# Patient Record
Sex: Female | Born: 1970 | Race: Black or African American | Hispanic: No | Marital: Single | Smoking: Never smoker
Health system: Southern US, Community
[De-identification: ages and names within clinical notes are randomized; demographics above are authoritative.]

## PROBLEM LIST (undated history)

## (undated) DIAGNOSIS — E785 Hyperlipidemia, unspecified: Secondary | ICD-10-CM

## (undated) DIAGNOSIS — I10 Essential (primary) hypertension: Secondary | ICD-10-CM

## (undated) DIAGNOSIS — Z8619 Personal history of other infectious and parasitic diseases: Secondary | ICD-10-CM

## (undated) HISTORY — DX: Hyperlipidemia, unspecified: E78.5

## (undated) HISTORY — DX: Essential (primary) hypertension: I10

## (undated) HISTORY — DX: Personal history of other infectious and parasitic diseases: Z86.19

---

## 1999-12-07 ENCOUNTER — Other Ambulatory Visit: Admission: RE | Admit: 1999-12-07 | Discharge: 1999-12-07 | Payer: Self-pay | Admitting: Family Medicine

## 2000-11-05 ENCOUNTER — Emergency Department (HOSPITAL_COMMUNITY): Admission: EM | Admit: 2000-11-05 | Discharge: 2000-11-05 | Payer: Self-pay | Admitting: Emergency Medicine

## 2000-11-05 ENCOUNTER — Encounter: Payer: Self-pay | Admitting: Emergency Medicine

## 2002-07-20 ENCOUNTER — Other Ambulatory Visit: Admission: RE | Admit: 2002-07-20 | Discharge: 2002-07-20 | Payer: Self-pay | Admitting: Obstetrics and Gynecology

## 2002-09-06 ENCOUNTER — Encounter: Payer: Self-pay | Admitting: Emergency Medicine

## 2002-09-06 ENCOUNTER — Emergency Department (HOSPITAL_COMMUNITY): Admission: EM | Admit: 2002-09-06 | Discharge: 2002-09-06 | Payer: Self-pay | Admitting: Emergency Medicine

## 2003-04-08 ENCOUNTER — Ambulatory Visit (HOSPITAL_COMMUNITY): Admission: RE | Admit: 2003-04-08 | Discharge: 2003-04-08 | Payer: Self-pay | Admitting: *Deleted

## 2003-04-24 ENCOUNTER — Inpatient Hospital Stay (HOSPITAL_COMMUNITY): Admission: AD | Admit: 2003-04-24 | Discharge: 2003-04-24 | Payer: Self-pay | Admitting: Obstetrics

## 2003-09-03 ENCOUNTER — Inpatient Hospital Stay (HOSPITAL_COMMUNITY): Admission: AD | Admit: 2003-09-03 | Discharge: 2003-09-03 | Payer: Self-pay | Admitting: Obstetrics & Gynecology

## 2003-09-04 ENCOUNTER — Inpatient Hospital Stay (HOSPITAL_COMMUNITY): Admission: AD | Admit: 2003-09-04 | Discharge: 2003-09-07 | Payer: Self-pay | Admitting: Obstetrics & Gynecology

## 2003-09-04 ENCOUNTER — Encounter (INDEPENDENT_AMBULATORY_CARE_PROVIDER_SITE_OTHER): Payer: Self-pay | Admitting: Specialist

## 2008-03-07 ENCOUNTER — Ambulatory Visit (HOSPITAL_COMMUNITY): Admission: RE | Admit: 2008-03-07 | Discharge: 2008-03-07 | Payer: Self-pay | Admitting: Obstetrics & Gynecology

## 2008-08-29 ENCOUNTER — Ambulatory Visit (HOSPITAL_COMMUNITY): Admission: RE | Admit: 2008-08-29 | Discharge: 2008-08-29 | Payer: Self-pay | Admitting: Obstetrics & Gynecology

## 2010-09-21 NOTE — Op Note (Signed)
NAME:  Victoria Mejia, Victoria Mejia                      ACCOUNT NO.:  [id number]   MEDICAL RECORD NO.:  [id number]                   PATIENT TYPE:  INP   LOCATION:  9105                                 FACILITY:  [facility]   PHYSICIAN:  Roseanna Rainbow, M.D.         DATE OF BIRTH:  May 16, 1970   DATE OF PROCEDURE:  09/05/2003  DATE OF DISCHARGE:                                 OPERATIVE REPORT   PREOPERATIVE DIAGNOSES:  1. Intrauterine pregnancy at term.  2. Arrest of dilatation, active phase.  3. Chorioamnionitis.   POSTOPERATIVE DIAGNOSES:  1. Intrauterine pregnancy at term.  2. Arrest of dilatation, active phase.  3. Chorioamnionitis.   PROCEDURE:  Primary lower uterine flap elliptical cesarean delivery via  Pfannenstiel skin incision.   SURGEON:  Roseanna Rainbow, M.D.   ANESTHESIA:  Epidural.   ESTIMATED BLOOD LOSS:  800 mL.   URINE OUTPUT AND INTRAVENOUS FLUIDS:  As per anesthesiology.   COMPLICATIONS:  None.   PROCEDURE:  The patient was taken to the operating room with an epidural  catheter in place.  She was placed in the dorsal supine position and prepped  and draped in the usual sterile fashion with a leftward tilt.  A  Pfannenstiel skin incision was then made with a scalpel and carried out to  the underlying fascia with the Bovie.  The fascia was nicked in the midline  and this incision was extended bilaterally with curved Mayo scissors.  The  superior aspect of the fascial incision was then grasped with Kocher clamps  and the underlying rectus muscles were dissected off.  The inferior aspect  of the fascial incision was manipulated in a similar fashion.  The rectus  muscles were separated in the midline.  The parietal peritoneum was tented  up and entered sharply with Metzenbaum scissors.  This incision was then  extended superiorly and inferiorly with good visualization of the bladder.  The bladder blade was then placed.  The vesicouterine peritoneum was  grasped  and entered sharply with Metzenbaum scissors.  This incision was then  extended bilaterally and the bladder flap created digitally.  The bladder  blade was then replaced.  The lower uterine segment was incised in a  transverse fashion with the scalpel.  The uterine incision was then extended  bluntly.  The infant's head was then delivered atraumatically.  The infant  was suctioned with the bulb suction.  The cord was clamped and cut and the  infant was handed off to the awaiting neonatologist.  The placenta was then  removed.  The uterus was evacuated of any remaining amniotic fluid, clots,  and debris with a moistened laparotomy sponge.  The uterine incision was  then reapproximated in a running fashion with 0 Monocryl in a running  interlocking fashion.  A second layer of the same suture was used to  imbricate this layer.  Adequate hemostasis was noted.  The paracolic gutters  were then copiously irrigated.  The parietal peritoneum was reapproximated  with 0 Vicryl in a running  fashion.  The fascia was reapproximated with 0 Vicryl in a running fashion.  The skin was reapproximated with staples.  At the close of the procedure the  instrument and pack counts were said to be correct x1.  The patient was  taken awake to the PACU in stable condition.                                               Roseanna Rainbow, M.D.    Judee Clara  D:  09/05/2003  T:  09/05/2003  Job:  [id number]

## 2010-09-21 NOTE — Discharge Summary (Signed)
NAME:  Victoria Mejia, Victoria Mejia                      ACCOUNT NO.:  [id number]   MEDICAL RECORD NO.:  [id number]                   PATIENT TYPE:  INP   LOCATION:  9105                                 FACILITY:  [facility]   PHYSICIAN:  Roseanna Rainbow, M.D.         DATE OF BIRTH:  [birth year]   DATE OF ADMISSION:  09/04/2003  DATE OF DISCHARGE:  09/07/2003                                 DISCHARGE SUMMARY   ADMISSION DATE:  Sep 04, 2003   ATTENDING PHYSICIAN ON DAY OF ADMISSION:  Dr. Antionette Char.   ADMITTING DIAGNOSIS:  Intrauterine pregnancy at term in latent labor.   DATE OF DISCHARGE:  Sep 07, 2003   ATTENDING PHYSICIAN ON DAY OF DISCHARGE:  Dr. Antionette Char.   DISCHARGE DIAGNOSIS:  Postpartum postoperative day #2.5 status post primary  cesarean section in stable condition with viable newborn.   PATIENT PRESENTATION:  The patient presents to the maternity admissions unit  with spontaneous rupture of membranes at approximately 0225 on Sep 04, 2003  in latent labor.  The patient's cervical exam reveals 2 cm reveals 2 cm,  70%, vertex, at a -1 station, with clear fluid.  Fetal heart rate tracing is  reassuring with contractions every 3-5 minutes.  The patient is admitted for  labor management.   PATIENT HISTORY:  The patient is a gravida 2 para 0-0-1-0 with an unknown  last menstrual period.  EDC is Sep 10, 2003 by early second trimester  ultrasound.  The patient's labs: The patient is O positive, antibody screen  is negative, RPR is nonreactive, rubella titer is immune, hepatitis surface  antigen is negative,  group beta strep culture is negative.  The patient has  a history of sickle cell trait and an abnormal Pap smear with treatment in  1999 with a LEEP procedure followed by normal Pap smears.  The patient has  no known drug allergies.  The patient's current medications include a  prenatal vitamin once daily.  The patient has no substance use.  The patient  also has a  history during pregnancy of an elevated 1-hour sugar test with  borderline glucose intolerance.   The patient is admitted in latent labor on Sep 04, 2003 at approximately  2200.  The patient has an elevated temperature over 101 with arrest of  dilatation and early chorioamnionitis.  The patient is then transferred to  the OR suite for delivery by primary C-section with Dr. Antionette Char;  see operative note.  The patient delivered a female on Sep 04, 2003 at 2349  with Apgar scores of 8 and 9 respectively.  Postoperative day #0  preoperative hemoglobin was 9.2 with postoperative hemoglobin at 7.7.  Platelet count preoperatively was 376 with a postoperative platelet count of  291.  RPR is nonreactive during hospitalization.  The patient had an  uneventful postoperative course, is discharged on day #2.5.  Incision is  clean, dry, and intact with staples.  Vital signs  are within normal limits.  The patient is up ad lib, tolerating diet, without any complaints.  Discharged home in stable condition with newborn.  The patient is  breastfeeding with lactation support.  The patient is to follow up at the  [facility] in approximately 1 week for incision check.   MEDICATIONS AT DISCHARGE:  1. Motrin 800 mg q.8h. as needed for discomfort.  2. Percocet one to two tablets by mouth as needed p.r.n. for pain q.4-6h.  3. The patient is to start Micronor for contraception in approximately 3     weeks.  4. Continue prenatal vitamins once daily.  5. Hemocyte twice a day for chronic anemia compounded with acute blood loss     anemia.   WOUND CARE:  The patient to call for any increased pain or drainage from  incision site.   ACTIVITY:  Ad lib with restrictions of no heavy lifting x6 weeks.   The patient is on a regular diet at time of discharge.     Marlinda Mike, C.N.M.                      Roseanna Rainbow, M.D.    TB/MEDQ  D:  10/26/2003  T:  10/29/2003  Job:  [id number]

## 2014-07-25 ENCOUNTER — Encounter: Payer: Self-pay | Admitting: Obstetrics

## 2014-07-25 ENCOUNTER — Ambulatory Visit (INDEPENDENT_AMBULATORY_CARE_PROVIDER_SITE_OTHER): Payer: Federal, State, Local not specified - PPO | Admitting: Obstetrics

## 2014-07-25 VITALS — BP 142/92 | HR 94 | Temp 97.7°F | Wt 174.0 lb

## 2014-07-25 DIAGNOSIS — O1002 Pre-existing essential hypertension complicating childbirth: Secondary | ICD-10-CM | POA: Diagnosis not present

## 2014-07-25 DIAGNOSIS — Z30433 Encounter for removal and reinsertion of intrauterine contraceptive device: Secondary | ICD-10-CM

## 2014-07-25 DIAGNOSIS — Z01812 Encounter for preprocedural laboratory examination: Secondary | ICD-10-CM

## 2014-07-25 LAB — POCT URINE PREGNANCY: Preg Test, Ur: NEGATIVE

## 2014-07-25 MED ORDER — TRIAMTERENE-HCTZ 37.5-25 MG PO CAPS: 1.0000 | ORAL_CAPSULE | Freq: Every day | ORAL | Status: DC

## 2014-07-25 NOTE — Patient Instructions (Signed)

## 2014-07-25 NOTE — Progress Notes (Signed)
IUD Removal and Reinsertion Procedure Note  Pre-operative Diagnosis: Desires IUD Removal and Reinsertion  Post-operative Diagnosis: same   Procedure:  IUD Removal and Reinsertion  Indications: contraception continuation after expiration of IUD  Procedure Details  Urine pregnancy test was done in office and result was negative.  The risks (including infection, bleeding, pain, and uterine perforation) and benefits of the procedure were explained to the patient and Written informed consent was obtained.    Sterile speculum inserted and the cervix isolated.  IUD string grasped with long dressing forcep and the IUD was removed, intact.  Cervix cleansed with Betadine. Uterus sounded to 7 cm. IUD inserted without difficulty. String visible and trimmed. Patient tolerated procedure well.  IUD Information: Mirena, Lot # [id number], Expiration date 06 / 18.  Condition: Stable  Complications: None  Plan:  The patient was advised to call for any fever or for prolonged or severe pain or bleeding. She was advised to use NSAID as needed for mild to moderate pain.   Attending Physician Documentation: I was present for or participated in the entire procedure, including opening and closing.

## 2014-07-27 LAB — SURESWAB, VAGINOSIS/VAGINITIS PLUS
ATOPOBIUM VAGINAE: NOT DETECTED Log (cells/mL)
C. PARAPSILOSIS, DNA: NOT DETECTED
C. TRACHOMATIS RNA, TMA: NOT DETECTED
C. TROPICALIS, DNA: NOT DETECTED
C. albicans, DNA: NOT DETECTED
C. glabrata, DNA: NOT DETECTED
Gardnerella vaginalis: <4.7 Log (cells/mL)
LACTOBACILLUS SPECIES: 7.6 Log (cells/mL)
MEGASPHAERA SPECIES: NOT DETECTED Log (cells/mL)
N. gonorrhoeae RNA, TMA: NOT DETECTED
T. VAGINALIS RNA, QL TMA: NOT DETECTED

## 2014-09-20 ENCOUNTER — Ambulatory Visit: Payer: Federal, State, Local not specified - PPO | Admitting: Obstetrics

## 2014-09-20 ENCOUNTER — Ambulatory Visit (INDEPENDENT_AMBULATORY_CARE_PROVIDER_SITE_OTHER): Payer: Federal, State, Local not specified - PPO | Admitting: Obstetrics

## 2014-09-20 ENCOUNTER — Encounter: Payer: Self-pay | Admitting: Obstetrics

## 2014-09-20 VITALS — BP 138/96 | HR 87 | Temp 98.3°F | Ht 64.0 in | Wt 173.0 lb

## 2014-09-20 DIAGNOSIS — Z30431 Encounter for routine checking of intrauterine contraceptive device: Secondary | ICD-10-CM | POA: Diagnosis not present

## 2014-09-20 DIAGNOSIS — Z1239 Encounter for other screening for malignant neoplasm of breast: Secondary | ICD-10-CM

## 2014-09-20 DIAGNOSIS — Z01419 Encounter for gynecological examination (general) (routine) without abnormal findings: Secondary | ICD-10-CM | POA: Diagnosis not present

## 2014-09-20 DIAGNOSIS — R102 Pelvic and perineal pain unspecified side: Secondary | ICD-10-CM

## 2014-09-20 DIAGNOSIS — Z124 Encounter for screening for malignant neoplasm of cervix: Secondary | ICD-10-CM | POA: Diagnosis not present

## 2014-09-21 ENCOUNTER — Encounter: Payer: Self-pay | Admitting: Obstetrics

## 2014-09-21 NOTE — Progress Notes (Signed)
Subjective:        Victoria Mejia is a 44 y.o. female here for a routine exam.  Current complaints: Pelvic pain with intercourse..    Personal health questionnaire:  Is patient Ashkenazi Jewish, have a family history of breast and/or ovarian cancer: no Is there a family history of uterine cancer diagnosed at age < 66, gastrointestinal cancer, urinary tract cancer, family member who is a Field seismologist syndrome-associated carrier: no Is the patient overweight and hypertensive, family history of diabetes, personal history of gestational diabetes, preeclampsia or PCOS: no Is patient over 67, have PCOS,  family history of premature CHD under age 43, diabetes, smoke, have hypertension or peripheral artery disease:  no At any time, has a partner hit, kicked or otherwise hurt or frightened you?: no Over the past 2 weeks, have you felt down, depressed or hopeless?: no Over the past 2 weeks, have you felt little interest or pleasure in doing things?:no   Gynecologic History No LMP recorded. Patient is not currently having periods (Reason: IUD). Contraception: IUD Last Pap: 2015. Results were: normal Last mammogram: none. Results were: n/a  Obstetric History OB History  No data available    History reviewed. No pertinent past medical history.  History reviewed. No pertinent past surgical history.   Current outpatient prescriptions:  .  triamterene-hydrochlorothiazide (DYAZIDE) 37.5-25 MG per capsule, Take 1 each (1 capsule total) by mouth daily., Disp: 30 capsule, Rfl: 11 Not on File  History  Substance Use Topics  . Smoking status: Never Smoker   . Smokeless tobacco: Not on file  . Alcohol Use: 0.0 oz/week    0 Standard drinks or equivalent per week     Comment: occasional    History reviewed. No pertinent family history.    Review of Systems  Constitutional: negative for fatigue and weight loss Respiratory: negative for cough and wheezing Cardiovascular: negative for chest  pain, fatigue and palpitations Gastrointestinal: negative for abdominal pain and change in bowel habits Musculoskeletal:negative for myalgias Neurological: negative for gait problems and tremors Behavioral/Psych: negative for abusive relationship, depression Endocrine: negative for temperature intolerance   Genitourinary:negative for abnormal menstrual periods, genital lesions, hot flashes, sexual problems and vaginal discharge Integument/breast: negative for breast lump, breast tenderness, nipple discharge and skin lesion(s)    Objective:       BP 138/96 mmHg  Pulse 87  Temp(Src) 98.3 F (36.8 C)  Ht 5\' 4"  (1.626 m)  Wt 173 lb (78.472 kg)  BMI 29.68 kg/m2 General:   alert  Skin:   no rash or abnormalities  Lungs:   clear to auscultation bilaterally  Heart:   regular rate and rhythm, S1, S2 normal, no murmur, click, rub or gallop  Breasts:   normal without suspicious masses, skin or nipple changes or axillary nodes  Abdomen:  normal findings: no organomegaly, soft, non-tender and no hernia  Pelvis:  External genitalia: normal general appearance Urinary system: urethral meatus normal and bladder without fullness, nontender Vaginal: normal without tenderness, induration or masses Cervix: normal appearance Adnexa: normal bimanual exam Uterus: anteverted and non-tender, normal size   Lab Review Urine pregnancy test Labs reviewed yes Radiologic studies reviewed no    Assessment:    Healthy female exam.    IUD Surveillance.  String not visible.  Dyspareunia   Plan:   Ultrasound ordered for IUD placement.   Education reviewed: calcium supplements, low fat, low cholesterol diet, safe sex/STD prevention, self breast exams and weight bearing exercise. Contraception: IUD. Mammogram ordered.  Follow up in: 1 year.   No orders of the defined types were placed in this encounter.   Orders Placed This Encounter  Procedures  . SureSwab, Vaginosis/Vaginitis Plus  . US  Transvaginal Non-OB    Standing Status: Future     Number of Occurrences:      Standing Expiration Date: 11/20/2015    Order Specific Question:  Reason for Exam (SYMPTOM  OR DIAGNOSIS REQUIRED)    Answer:  LLQ pain.  IUD string not visible.    Order Specific Question:  Preferred imaging location?    Answer:  [facility]  . MM Digital Screening    Standing Status: Future     Number of Occurrences:      Standing Expiration Date: 11/20/2015    Order Specific Question:  Reason for Exam (SYMPTOM  OR DIAGNOSIS REQUIRED)    Answer:  screening    Order Specific Question:  Is the patient pregnant?    Answer:  No    Order Specific Question:  Preferred imaging location?    Answer:  [facility]  . US Pelvis Complete    Standing Status: Future     Number of Occurrences:      Standing Expiration Date: 11/20/2015    Order Specific Question:  Reason for Exam (SYMPTOM  OR DIAGNOSIS REQUIRED)    Answer:  pelvic pain    Order Specific Question:  Preferred imaging location?    Answer:  [facility]

## 2014-09-22 LAB — PAP IG AND HPV HIGH-RISK: HPV DNA High Risk: NOT DETECTED

## 2014-09-23 LAB — SURESWAB, VAGINOSIS/VAGINITIS PLUS
ATOPOBIUM VAGINAE: NOT DETECTED Log (cells/mL)
C. TROPICALIS, DNA: NOT DETECTED
C. albicans, DNA: NOT DETECTED
C. glabrata, DNA: NOT DETECTED
C. parapsilosis, DNA: NOT DETECTED
C. trachomatis RNA, TMA: NOT DETECTED
LACTOBACILLUS SPECIES: 6.6 Log (cells/mL)
MEGASPHAERA SPECIES: NOT DETECTED Log (cells/mL)
N. gonorrhoeae RNA, TMA: NOT DETECTED
T. VAGINALIS RNA, QL TMA: NOT DETECTED

## 2014-09-26 ENCOUNTER — Telehealth: Payer: Self-pay | Admitting: *Deleted

## 2014-09-26 NOTE — Telephone Encounter (Signed)
Unable to reach patient at time of Pre-Visit Call.  Left message for patient to return call when available.    

## 2014-09-27 ENCOUNTER — Encounter: Payer: Self-pay | Admitting: Family

## 2014-09-27 ENCOUNTER — Encounter: Payer: Self-pay | Admitting: *Deleted

## 2014-09-27 ENCOUNTER — Ambulatory Visit (INDEPENDENT_AMBULATORY_CARE_PROVIDER_SITE_OTHER): Payer: Federal, State, Local not specified - PPO | Admitting: Family

## 2014-09-27 VITALS — BP 126/92 | HR 73 | Temp 97.9°F | Resp 16 | Ht 63.5 in | Wt 177.0 lb

## 2014-09-27 DIAGNOSIS — I10 Essential (primary) hypertension: Secondary | ICD-10-CM | POA: Insufficient documentation

## 2014-09-27 DIAGNOSIS — E785 Hyperlipidemia, unspecified: Secondary | ICD-10-CM

## 2014-09-27 DIAGNOSIS — R0789 Other chest pain: Secondary | ICD-10-CM | POA: Diagnosis not present

## 2014-09-27 LAB — BASIC METABOLIC PANEL
BUN: 9 mg/dL (ref 6–23)
CHLORIDE: 104 meq/L (ref 96–112)
CO2: 28 meq/L (ref 19–32)
Calcium: 9.2 mg/dL (ref 8.4–10.5)
Creatinine, Ser: 0.6 mg/dL (ref 0.40–1.20)
GFR: 140.9 mL/min (ref >60.00)
Glucose, Bld: 73 mg/dL (ref 70–99)
POTASSIUM: 3.5 meq/L (ref 3.5–5.1)
Sodium: 137 mEq/L (ref 135–145)

## 2014-09-27 LAB — LIPID PANEL
CHOL/HDL RATIO: 4
Cholesterol: 181 mg/dL (ref 0–200)
HDL: 51.1 mg/dL (ref >39.00)
LDL Cholesterol: 113 mg/dL — ABNORMAL HIGH (ref 0–99)
NonHDL: 129.9
TRIGLYCERIDES: 83 mg/dL (ref 0.0–149.0)
VLDL: 16.6 mg/dL (ref 0.0–40.0)

## 2014-09-27 MED ORDER — HYDROCHLOROTHIAZIDE 25 MG PO TABS: 25.0000 mg | ORAL_TABLET | Freq: Every day | ORAL | Status: DC

## 2014-09-27 NOTE — Progress Notes (Signed)
Pre visit review using our clinic review tool, if applicable. No additional management support is needed unless otherwise documented below in the visit note. 

## 2014-09-27 NOTE — Patient Instructions (Signed)
Please complete lab work prior to leaving. Schedule a nurse visit in 2 weeks for blood pressure recheck and follow up blood work (bmet). Schedule fasting physical at the front desk for a convenient time.  Call if you develop recurrent chest pain or shortness of breath. Go to ER if severe.  Welcome to [facility]!

## 2014-09-27 NOTE — Addendum Note (Signed)
Addended by: Leticia Penna A on: 09/27/2014 08:20 AM   Modules accepted: Medications

## 2014-09-27 NOTE — Assessment & Plan Note (Signed)
DBP consistently >90. Advised pt re: importance of good BP control. Will start hctz 25mg  once daily. Obtain baseline bmet today. Follow up in 2 weeks for RN visit bp check and follow up bmet (dx htn).

## 2014-09-27 NOTE — Assessment & Plan Note (Addendum)
Sounded like pleuritic chest pain which has resolved. Obtain baseline cxr today. Follow up as outlined in AVS.

## 2014-09-27 NOTE — Assessment & Plan Note (Signed)
Obtain flp. Discussed importance of low sodium, low cholesterol diet, exercise and weight loss.

## 2014-09-27 NOTE — Progress Notes (Signed)
Subjective:    Patient ID: Victoria Mejia, female    DOB: 1971/04/27, 44 y.o.   MRN: [id number]  HPI  Victoria Mejia is a 44 yr old female who presents today to establish care.   HTN- Patient is currently maintained on the following medications for blood pressure: none, previously on triamterene/hctz- ran out last week.   Patient reports good compliance with blood pressure medications. Patient denies chest pain, shortness of breath or swelling. Last 3 blood pressure readings in our office are as follows: BP Readings from Last 3 Encounters:  09/27/14 126/92  09/20/14 138/96  07/25/14 142/92   Hyperlipidemia- Pt reports + hx of hyperlipidemia- no lipids in her system.   Intermittent pain under the left breast x 2 weeks.  "like a gas bubble."  Took gasex with some improvement. Denies current pain. Denies SOB, calf pain or recent long travel. Denies tenderness to palpation.   Review of Systems  Constitutional: Negative for unexpected weight change.  HENT: Positive for rhinorrhea. Negative for hearing loss.   Eyes: Negative for visual disturbance.  Respiratory: Negative for cough.   Cardiovascular: Negative for leg swelling.  Gastrointestinal: Negative for nausea, diarrhea and constipation.  Genitourinary: Negative for dysuria and frequency.  Musculoskeletal: Negative for myalgias and arthralgias.  Skin: Negative for rash.  Neurological: Negative for headaches.  Hematological: Negative for adenopathy.  Psychiatric/Behavioral: Negative for dysphoric mood and agitation.       Past Medical History  Diagnosis Date  . Hypertension   . Hyperlipidemia   . History of chicken pox     History   Social History  . Marital Status: Single    Spouse Name: N/A  . Number of Children: N/A  . Years of Education: N/A   Occupational History  . Not on file.   Social History Main Topics  . Smoking status: Never Smoker   . Smokeless tobacco: Not on file  . Alcohol Use: 0.0 oz/week    0 Standard drinks or equivalent per week     Comment: occasional  . Drug Use: No  . Sexual Activity: Yes    Birth Control/ Protection: IUD   Other Topics Concern  . Not on file   Social History Narrative    No past surgical history on file.  Family History  Problem Relation Age of Onset  . Hypertension Father   . Alcohol abuse Father   . Stroke Maternal Grandmother   . Arthritis Mother   . Hyperlipidemia Mother   . Heart disease Maternal Uncle     No Known Allergies  Current Outpatient Prescriptions on File Prior to Visit  Medication Sig Dispense Refill  . triamterene-hydrochlorothiazide (DYAZIDE) 37.5-25 MG per capsule Take 1 each (1 capsule total) by mouth daily. (Patient not taking: Reported on 09/27/2014) 30 capsule 11   No current facility-administered medications on file prior to visit.    BP 126/92 mmHg  Pulse 73  Temp(Src) 97.9 F (36.6 C) (Oral)  Resp 16  Ht 5' 3.5" (1.613 m)  Wt 177 lb (80.287 kg)  BMI 30.86 kg/m2  SpO2 99%    Objective:   Physical Exam  Constitutional: She is oriented to person, place, and time. She appears well-developed and well-nourished.  HENT:  Head: Normocephalic and atraumatic.  Right Ear: Tympanic membrane and ear canal normal.  Left Ear: Tympanic membrane and ear canal normal.  Mouth/Throat: No posterior oropharyngeal edema or posterior oropharyngeal erythema.  Cardiovascular: Normal rate, regular rhythm and normal heart sounds.  No murmur heard. Pulmonary/Chest: Effort normal and breath sounds normal. No respiratory distress. She has no wheezes.  Neurological: She is alert and oriented to person, place, and time.  Psychiatric: She has a normal mood and affect. Her behavior is normal. Judgment and thought content normal.          Assessment & Plan:

## 2014-09-27 NOTE — Telephone Encounter (Signed)
Pre-Visit Call completed with patient and chart updated.   Pre-Visit Info documented in Specialty Comments under [employer].    

## 2014-09-28 ENCOUNTER — Encounter: Payer: Self-pay | Admitting: Family

## 2014-10-06 ENCOUNTER — Ambulatory Visit (HOSPITAL_COMMUNITY): Payer: Federal, State, Local not specified - PPO

## 2014-10-06 ENCOUNTER — Other Ambulatory Visit: Payer: Self-pay | Admitting: Obstetrics

## 2014-10-06 ENCOUNTER — Ambulatory Visit (HOSPITAL_COMMUNITY)
Admission: RE | Admit: 2014-10-06 | Discharge: 2014-10-06 | Disposition: A | Discharge status: Home / Self Care | Payer: Federal, State, Local not specified - PPO | Source: Ambulatory Visit | Attending: Obstetrics | Admitting: Obstetrics

## 2014-10-06 DIAGNOSIS — D252 Subserosal leiomyoma of uterus: Secondary | ICD-10-CM | POA: Insufficient documentation

## 2014-10-06 DIAGNOSIS — R102 Pelvic and perineal pain: Secondary | ICD-10-CM | POA: Insufficient documentation

## 2014-10-06 DIAGNOSIS — N941 Dyspareunia: Secondary | ICD-10-CM | POA: Insufficient documentation

## 2014-10-06 DIAGNOSIS — Z30431 Encounter for routine checking of intrauterine contraceptive device: Secondary | ICD-10-CM | POA: Insufficient documentation

## 2014-10-06 DIAGNOSIS — Z1239 Encounter for other screening for malignant neoplasm of breast: Secondary | ICD-10-CM

## 2014-10-06 DIAGNOSIS — Z1231 Encounter for screening mammogram for malignant neoplasm of breast: Secondary | ICD-10-CM | POA: Diagnosis not present

## 2014-10-06 DIAGNOSIS — D250 Submucous leiomyoma of uterus: Secondary | ICD-10-CM | POA: Diagnosis not present

## 2014-10-11 ENCOUNTER — Ambulatory Visit: Payer: Federal, State, Local not specified - PPO | Admitting: Family

## 2014-10-11 VITALS — BP 138/91 | HR 87

## 2014-10-11 DIAGNOSIS — I10 Essential (primary) hypertension: Secondary | ICD-10-CM

## 2014-10-11 NOTE — Progress Notes (Signed)
Pre visit review using our clinic review tool, if applicable. No additional management support is needed unless otherwise documented below in the visit note.   Patient presents today for BP check per last office visit note:   HTN (hypertension) - [name], NP at 09/27/2014 10:50 AM     Status: Written Related Problem: HTN (hypertension)   Expand All Collapse All   DBP consistently >90. Advised pt re: importance of good BP control. Will start hctz 25mg  once daily. Obtain baseline bmet today. Follow up in 2 weeks for RN visit bp check and follow up bmet (dx htn).

## 2014-10-18 ENCOUNTER — Telehealth: Payer: Self-pay | Admitting: Family

## 2014-10-18 NOTE — Telephone Encounter (Signed)
Pre Visit letter sent  °

## 2014-10-27 ENCOUNTER — Encounter: Payer: Self-pay | Admitting: *Deleted

## 2014-11-08 ENCOUNTER — Encounter: Payer: Self-pay | Admitting: Obstetrics

## 2014-11-08 ENCOUNTER — Ambulatory Visit (INDEPENDENT_AMBULATORY_CARE_PROVIDER_SITE_OTHER): Payer: Federal, State, Local not specified - PPO | Admitting: Obstetrics

## 2014-11-08 VITALS — BP 167/108 | HR 98 | Temp 98.6°F | Ht 64.0 in | Wt 176.0 lb

## 2014-11-08 DIAGNOSIS — Z30433 Encounter for removal and reinsertion of intrauterine contraceptive device: Secondary | ICD-10-CM | POA: Diagnosis not present

## 2014-11-09 ENCOUNTER — Telehealth: Payer: Self-pay | Admitting: *Deleted

## 2014-11-09 ENCOUNTER — Encounter: Payer: Self-pay | Admitting: Obstetrics

## 2014-11-09 ENCOUNTER — Encounter: Payer: Self-pay | Admitting: Family

## 2014-11-09 ENCOUNTER — Ambulatory Visit (INDEPENDENT_AMBULATORY_CARE_PROVIDER_SITE_OTHER): Payer: Federal, State, Local not specified - PPO | Admitting: Family

## 2014-11-09 VITALS — BP 126/82 | HR 88 | Temp 98.2°F | Resp 16 | Ht 63.5 in | Wt 175.0 lb

## 2014-11-09 DIAGNOSIS — Z23 Encounter for immunization: Secondary | ICD-10-CM

## 2014-11-09 DIAGNOSIS — Z0000 Encounter for general adult medical examination without abnormal findings: Secondary | ICD-10-CM

## 2014-11-09 LAB — URINALYSIS, ROUTINE W REFLEX MICROSCOPIC
BILIRUBIN URINE: NEGATIVE
Ketones, ur: NEGATIVE
LEUKOCYTES UA: NEGATIVE
NITRITE: NEGATIVE
PH: 5.5 (ref 5.0–8.0)
SPECIFIC GRAVITY, URINE: 1.025 (ref 1.000–1.030)
TOTAL PROTEIN, URINE-UPE24: NEGATIVE
Urine Glucose: NEGATIVE
Urobilinogen, UA: 0.2 (ref 0.0–1.0)
WBC UA: NONE SEEN (ref 0–?)

## 2014-11-09 LAB — TSH: TSH: 1.64 u[IU]/mL (ref 0.35–4.50)

## 2014-11-09 LAB — HEPATIC FUNCTION PANEL
ALBUMIN: 4.2 g/dL (ref 3.5–5.2)
ALK PHOS: 73 U/L (ref 39–117)
ALT: 14 U/L (ref 0–35)
AST: 14 U/L (ref 0–37)
Bilirubin, Direct: 0.1 mg/dL (ref 0.0–0.3)
Total Bilirubin: 0.8 mg/dL (ref 0.2–1.2)
Total Protein: 7.4 g/dL (ref 6.0–8.3)

## 2014-11-09 MED ORDER — HYDROCHLOROTHIAZIDE 25 MG PO TABS: 25.0000 mg | ORAL_TABLET | Freq: Every day | ORAL | Status: DC

## 2014-11-09 NOTE — Patient Instructions (Signed)
Please complete lab work prior to leaving. Try to increase walking to 30 minutes 5 days a week and increase fresh fruits/veggies, reduce refined sugar.

## 2014-11-09 NOTE — Addendum Note (Signed)
Addended by: Kelle Darting A on: 11/09/2014 03:53 PM   Modules accepted: Orders

## 2014-11-09 NOTE — Progress Notes (Signed)
IUD Insertion Procedure Note  Pre-operative Diagnosis: Desires contraception  Post-operative Diagnosis: same  Indications: contraception  Procedure Details  Urine pregnancy test was done in office and result was negative.  The risks (including infection, bleeding, pain, and uterine perforation) and benefits of the procedure were explained to the patient and Written informed consent was obtained.    Cervix cleansed with Betadine. Uterus sounded to 7 cm. IUD inserted without difficulty. String visible and trimmed. Patient tolerated procedure well.  IUD Information: Mirena, Lot # [id number], Expiration date 27 / 64.  Condition: Stable  Complications: None  Plan:  The patient was advised to call for any fever or for prolonged or severe pain or bleeding. She was advised to use NSAID as needed for mild to moderate pain.   Attending Physician Documentation: I was present for or participated in the entire procedure, including opening and closing.

## 2014-11-09 NOTE — Assessment & Plan Note (Signed)
Tdap today. Discussed diet and exercise. Mammo/pap up to date.

## 2014-11-09 NOTE — Progress Notes (Signed)
Subjective:    Patient ID: Victoria Mejia, female    DOB: [birth year], 44 y.o.   MRN: [id number]  HPI  Victoria Mejia is a 44 yr old female who presents today for cpx.  Patient presents today for complete physical.  Immunizations: due for tetanus Diet: could be better.  Needs more fruits/veggies, less sugar Exercise: walks 2-3 times a week for 15-20 Pap Smear: 5/16- GYN normal per pt Mammogram:  Up to date Vision: up to date Dental:  Up to date    Review of Systems  Constitutional: Negative for unexpected weight change.  HENT: Negative for hearing loss and rhinorrhea.   Eyes: Negative for visual disturbance.  Respiratory: Negative for cough and shortness of breath.   Cardiovascular: Negative for leg swelling.  Gastrointestinal: Negative for nausea, diarrhea and constipation.  Genitourinary: Negative for dysuria, frequency and menstrual problem.  Musculoskeletal: Negative for myalgias and arthralgias.       Reports occasional right forearm discomfort  Skin: Negative for rash.  Neurological:       Reports mild HA's   Hematological: Negative for adenopathy.  Psychiatric/Behavioral: Negative for dysphoric mood and agitation.   Past Medical History  Diagnosis Date  . Hypertension   . Hyperlipidemia   . History of chicken pox     History   Social History  . Marital Status: Single    Spouse Name: N/A  . Number of Children: N/A  . Years of Education: N/A   Occupational History  . Not on file.   Social History Main Topics  . Smoking status: Never Smoker   . Smokeless tobacco: Not on file  . Alcohol Use: 0.0 oz/week    0 Standard drinks or equivalent per week     Comment: occasional  . Drug Use: No  . Sexual Activity: Yes    Birth Control/ Protection: IUD   Other Topics Concern  . Not on file   Social History Narrative   Single, has live in boyfriend   Daughter 2005   Works in [employer]   Completed college   No pets   Enjoys television       Past  Surgical History  Procedure Laterality Date  . Cesarean section  2005    Family History  Problem Relation Age of Onset  . Hypertension Father   . Alcohol abuse Father   . Stroke Maternal Grandmother   . Arthritis Mother   . Hyperlipidemia Mother   . Heart disease Maternal Uncle 50    No Known Allergies  No current outpatient prescriptions on file prior to visit.   No current facility-administered medications on file prior to visit.    BP 126/82 mmHg  Pulse 88  Temp(Src) 98.2 F (36.8 C) (Oral)  Resp 16  Ht 5' 3.5" (1.613 m)  Wt 175 lb (79.379 kg)  BMI 30.51 kg/m2  SpO2 99%       Objective:   Physical Exam  Physical Exam  Constitutional: She is oriented to person, place, and time. She appears well-developed and well-nourished. No distress.  HENT:  Head: Normocephalic and atraumatic.  Right Ear: Tympanic membrane and ear canal normal.  Left Ear: Tympanic membrane and ear canal normal.  Mouth/Throat: Oropharynx is clear and moist.  Eyes: Pupils are equal, round, and reactive to light. No scleral icterus.  Neck: Normal range of motion. No thyromegaly present.  Cardiovascular: Normal rate and regular rhythm.   No murmur heard. Pulmonary/Chest: Effort normal and breath sounds normal. No respiratory distress.  He has no wheezes. She has no rales. She exhibits no tenderness.  Abdominal: Soft. Bowel sounds are normal. He exhibits no distension and no mass. There is no tenderness. There is no rebound and no guarding.  Musculoskeletal: She exhibits no edema.  Lymphadenopathy:    She has no cervical adenopathy.  Neurological: She is alert and oriented to person, place, and time. She has normal patellar reflexes. She exhibits normal muscle tone. Coordination normal.  Skin: Skin is warm and dry.  Psychiatric: She has a normal mood and affect. Her behavior is normal. Judgment and thought content normal.  Breasts: Examined lying Right: Without masses, retractions, discharge  or axillary adenopathy.  Left: Without masses, retractions, discharge or axillary adenopathy.  Inguinal/mons: Normal without inguinal adenopathy       Assessment & Plan:       Assessment & Plan:

## 2014-11-09 NOTE — Progress Notes (Signed)
Subjective:    Victoria Mejia is a 44 y.o. female who presents for contraception counseling. The patient has c/o of heavy vaginal bleeding and cramping for past 3 weeks. The patient is sexually active. Pertinent past medical history: none.  The information documented in the HPI was reviewed and verified.  Menstrual History: OB History    No data available       No LMP recorded. Patient is not currently having periods (Reason: IUD).   Patient Active Problem List   Diagnosis Date Noted  . Preventative health care 11/09/2014  . HTN (hypertension) 09/27/2014  . Atypical chest pain 09/27/2014  . Hyperlipidemia 09/27/2014   Past Medical History  Diagnosis Date  . Hypertension   . Hyperlipidemia   . History of chicken pox     Past Surgical History  Procedure Laterality Date  . Cesarean section  2005     Current outpatient prescriptions:  .  hydrochlorothiazide (HYDRODIURIL) 25 MG tablet, Take 1 tablet (25 mg total) by mouth daily., Disp: 90 tablet, Rfl: 1 No Known Allergies  History  Substance Use Topics  . Smoking status: Never Smoker   . Smokeless tobacco: Not on file  . Alcohol Use: 0.0 oz/week    0 Standard drinks or equivalent per week     Comment: occasional    Family History  Problem Relation Age of Onset  . Hypertension Father   . Alcohol abuse Father   . Stroke Maternal Grandmother   . Arthritis Mother   . Hyperlipidemia Mother   . Heart disease Maternal Uncle 50       Review of Systems Constitutional: negative for weight loss Genitourinary:negative for abnormal menstrual periods and vaginal discharge   Objective:   BP 167/108 mmHg  Pulse 98  Temp(Src) 98.6 F (37 C)  Ht 5\' 4"  (1.626 m)  Wt 176 lb (79.833 kg)  BMI 30.20 kg/m2           General:  Alert.  No distress Abdomen:  normal findings: no organomegaly, soft, non-tender and no hernia  Pelvis:  External genitalia: normal general appearance Urinary system: urethral meatus normal and  bladder without fullness, nontender Vaginal: normal without tenderness, induration or masses Cervix: normal appearance.  Shaft of IUD observed protruding through cervical os.  IUD string grasped with dressing forceps and IUD easily removed, intact. Adnexa: normal bimanual exam Uterus: anteverted and non-tender, normal size   Lab Review Urine pregnancy test Labs reviewed yes Radiologic studies reviewed yes    Assessment:    44 y.o., continuing IUD, no contraindications.    Desires re insertion of new Mirena IUD  Plan:   IUD removal and re insertion ( see procedure note )   All questions answered. Discussed healthy lifestyle modifications. Follow up in 6 weeks.    No orders of the defined types were placed in this encounter.   Orders Placed This Encounter  Procedures  . US OB Transvaginal    Standing Status: Future     Number of Occurrences:      Standing Expiration Date: 01/09/2016    Order Specific Question:  Reason for Exam (SYMPTOM  OR DIAGNOSIS REQUIRED)    Answer:  IUD placement    Order Specific Question:  Preferred imaging location?    Answer:  Internal

## 2014-11-09 NOTE — Progress Notes (Signed)
Pre visit review using our clinic review tool, if applicable. No additional management support is needed unless otherwise documented below in the visit note. 

## 2014-11-09 NOTE — Telephone Encounter (Signed)
Attempted to document tdap injection into [employer] and found pt with same name / dob but different address of [address], [place]. Sent pt mychart message to verify if this was a previous address for pt. Awaiting response so I can update [facility].

## 2014-11-10 LAB — CBC WITH DIFFERENTIAL/PLATELET
BASOS ABS: 0 10*3/uL (ref 0.0–0.1)
Basophils Relative: 0.8 % (ref 0.0–3.0)
Eosinophils Absolute: 0.1 10*3/uL (ref 0.0–0.7)
Eosinophils Relative: 1.4 % (ref 0.0–5.0)
HEMATOCRIT: 38.2 % (ref 36.0–46.0)
Hemoglobin: 12.6 g/dL (ref 12.0–15.0)
LYMPHS ABS: 1.8 10*3/uL (ref 0.7–4.0)
LYMPHS PCT: 34.1 % (ref 12.0–46.0)
MCHC: 33.1 g/dL (ref 30.0–36.0)
MCV: 79.5 fl (ref 78.0–100.0)
MONO ABS: 0.2 10*3/uL (ref 0.1–1.0)
Monocytes Relative: 4.7 % (ref 3.0–12.0)
NEUTROS PCT: 59 % (ref 43.0–77.0)
Neutro Abs: 3.1 10*3/uL (ref 1.4–7.7)
PLATELETS: 369 10*3/uL (ref 150.0–400.0)
RBC: 4.8 Mil/uL (ref 3.87–5.11)
RDW: 15.9 % — ABNORMAL HIGH (ref 11.5–15.5)
WBC: 5.3 10*3/uL (ref 4.0–10.5)

## 2014-11-10 NOTE — Telephone Encounter (Signed)
Pt confirmed previous address via mychart message. [facility] updated.

## 2014-11-13 ENCOUNTER — Encounter: Payer: Self-pay | Admitting: Family

## 2014-12-01 ENCOUNTER — Other Ambulatory Visit: Payer: Federal, State, Local not specified - PPO

## 2014-12-20 ENCOUNTER — Ambulatory Visit: Payer: Federal, State, Local not specified - PPO | Admitting: Obstetrics

## 2014-12-22 ENCOUNTER — Ambulatory Visit (INDEPENDENT_AMBULATORY_CARE_PROVIDER_SITE_OTHER): Payer: Federal, State, Local not specified - PPO

## 2014-12-22 ENCOUNTER — Ambulatory Visit (INDEPENDENT_AMBULATORY_CARE_PROVIDER_SITE_OTHER): Payer: Federal, State, Local not specified - PPO | Admitting: Obstetrics

## 2014-12-22 ENCOUNTER — Other Ambulatory Visit: Payer: Self-pay | Admitting: Obstetrics

## 2014-12-22 ENCOUNTER — Encounter: Payer: Self-pay | Admitting: Obstetrics

## 2014-12-22 VITALS — BP 151/94 | HR 82 | Temp 98.2°F | Ht 64.0 in | Wt 180.1 lb

## 2014-12-22 DIAGNOSIS — Z30433 Encounter for removal and reinsertion of intrauterine contraceptive device: Secondary | ICD-10-CM | POA: Diagnosis not present

## 2014-12-22 DIAGNOSIS — Z975 Presence of (intrauterine) contraceptive device: Secondary | ICD-10-CM

## 2014-12-23 NOTE — Progress Notes (Signed)
Patient presented for ultrasound.  Appointment rescheduled for physician follow up.

## 2015-01-17 ENCOUNTER — Other Ambulatory Visit: Payer: Self-pay | Admitting: *Deleted

## 2015-01-17 ENCOUNTER — Encounter: Payer: Self-pay | Admitting: Obstetrics

## 2015-01-17 ENCOUNTER — Ambulatory Visit (INDEPENDENT_AMBULATORY_CARE_PROVIDER_SITE_OTHER): Payer: Federal, State, Local not specified - PPO | Admitting: Obstetrics

## 2015-01-17 VITALS — BP 134/96 | HR 93 | Temp 98.2°F | Ht 64.0 in | Wt 177.0 lb

## 2015-01-17 DIAGNOSIS — Z538 Procedure and treatment not carried out for other reasons: Secondary | ICD-10-CM

## 2015-01-17 DIAGNOSIS — Z975 Presence of (intrauterine) contraceptive device: Secondary | ICD-10-CM | POA: Diagnosis not present

## 2015-01-17 NOTE — H&P (Signed)
Patient ID: Victoria Mejia, female   DOB: [birth year], 44 y.o.   MRN: [id number]  Chief Complaint  Patient presents with  . Procedure    HPI Victoria Mejia is a 44 y.o. female.  H/O heavy vaginal bleeding with IUD.  Ultrasound revealed the IUD to be out of place in lower uterine segment.  Patient presents for IUD removal.  HPI  Past Medical History  Diagnosis Date  . Hypertension   . Hyperlipidemia   . History of chicken pox     Past Surgical History  Procedure Laterality Date  . Cesarean section  2005    Family History  Problem Relation Age of Onset  . Hypertension Father   . Alcohol abuse Father   . Stroke Maternal Grandmother   . Arthritis Mother   . Hyperlipidemia Mother   . Heart disease Maternal Uncle 42    Social History Social History  Substance Use Topics  . Smoking status: Never Smoker   . Smokeless tobacco: None  . Alcohol Use: 0.0 oz/week    0 Standard drinks or equivalent per week     Comment: occasional    No Known Allergies  Current Outpatient Prescriptions  Medication Sig Dispense Refill  . hydrochlorothiazide (HYDRODIURIL) 25 MG tablet Take 1 tablet (25 mg total) by mouth daily. 90 tablet 1   No current facility-administered medications for this visit.    Review of Systems Review of Systems Constitutional: negative for fatigue and weight loss Respiratory: negative for cough and wheezing Cardiovascular: negative for chest pain, fatigue and palpitations Gastrointestinal: negative for abdominal pain and change in bowel habits Genitourinary:negative Integument/breast: negative for nipple discharge Musculoskeletal:negative for myalgias Neurological: negative for gait problems and tremors Behavioral/Psych: negative for abusive relationship, depression Endocrine: negative for temperature intolerance     Blood pressure 134/96, pulse 93, temperature 98.2 F (36.8 C), height 5\' 4"  (1.626 m), weight 177 lb (80.287 kg), last menstrual period  12/14/2014.  Physical Exam Physical Exam            General:  Alert and no distress. Abdomen:  normal findings: no organomegaly, soft, non-tender and no hernia  Pelvis:  External genitalia: normal general appearance Urinary system: urethral meatus normal and bladder without fullness, nontender Vaginal: normal without tenderness, induration or masses Cervix: normal appearance.  IUD string visible and grasped with dressing forceps, but IUD would not flex and descend.       Data Reviewed Ultrasound  Assessment     Attempted IUD removal, unsuccessful.     Plan     Will schedule IUD removal in OR with Hysteroscopic guidance   No orders of the defined types were placed in this encounter.   No orders of the defined types were placed in this encounter.

## 2015-01-20 LAB — SURESWAB, VAGINOSIS/VAGINITIS PLUS
ATOPOBIUM VAGINAE: NOT DETECTED Log (cells/mL)
C. ALBICANS, DNA: NOT DETECTED
C. TRACHOMATIS RNA, TMA: NOT DETECTED
C. glabrata, DNA: NOT DETECTED
C. parapsilosis, DNA: NOT DETECTED
C. tropicalis, DNA: NOT DETECTED
Gardnerella vaginalis: NOT DETECTED Log (cells/mL)
LACTOBACILLUS SPECIES: >8 Log (cells/mL)
MEGASPHAERA SPECIES: NOT DETECTED Log (cells/mL)
N. gonorrhoeae RNA, TMA: NOT DETECTED
T. VAGINALIS RNA, QL TMA: NOT DETECTED

## 2015-01-23 ENCOUNTER — Telehealth: Payer: Self-pay | Admitting: *Deleted

## 2015-01-23 NOTE — Telephone Encounter (Signed)
Patient states she is scheduled for procedure at [facility] on Wednesday and she has not heard anything from them. She has heard from the pharmacy- but no one else. 10:43 Call to patient- LM on VM- Patient should arrive 1-2 hours prior to procedure and NPO at midnight. Patient given number to [facility] admitting so she can touch base with them.

## 2015-01-24 ENCOUNTER — Telehealth: Payer: Self-pay

## 2015-01-24 ENCOUNTER — Encounter (HOSPITAL_COMMUNITY): Payer: Self-pay | Admitting: Anesthesiology

## 2015-01-24 NOTE — Telephone Encounter (Signed)
Left message regarding when to arrive and NPO instructions.

## 2015-01-24 NOTE — Telephone Encounter (Signed)
patient has questions about her procedure tomorrow - [place] said she should talk to Dr, Jodi Mourning

## 2015-01-24 NOTE — Anesthesia Preprocedure Evaluation (Addendum)
Anesthesia Evaluation  Patient identified by MRN, date of birth, ID band Patient awake    Reviewed: Allergy & Precautions, NPO status , Patient's Chart, lab work & pertinent test results  Airway Mallampati: III  TM Distance: >3 FB Neck ROM: Full    Dental no notable dental hx. (+) Teeth Intact   Pulmonary neg pulmonary ROS,    Pulmonary exam normal breath sounds clear to auscultation       Cardiovascular hypertension, Pt. on medications Normal cardiovascular exam Rhythm:Regular Rate:Normal     Neuro/Psych negative neurological ROS  negative psych ROS   GI/Hepatic negative GI ROS, Neg liver ROS,   Endo/Other  Hyperlipidemia Obesity   Renal/GU negative Renal ROS  negative genitourinary   Musculoskeletal negative musculoskeletal ROS (+)   Abdominal   Peds  Hematology negative hematology ROS (+)   Anesthesia Other Findings   Reproductive/Obstetrics Embedded IUD                            Anesthesia Physical Anesthesia Plan  ASA: II  Anesthesia Plan: General   Post-op Pain Management:    Induction: Intravenous  Airway Management Planned: LMA  Additional Equipment:   Intra-op Plan:   Post-operative Plan: Extubation in OR  Informed Consent: I have reviewed the patients History and Physical, chart, labs and discussed the procedure including the risks, benefits and alternatives for the proposed anesthesia with the patient or authorized representative who has indicated his/her understanding and acceptance.   Dental advisory given  Plan Discussed with: Anesthesiologist, CRNA and Surgeon  Anesthesia Plan Comments:         Anesthesia Quick Evaluation

## 2015-01-25 ENCOUNTER — Ambulatory Visit (HOSPITAL_COMMUNITY)
Admission: RE | Admit: 2015-01-25 | Discharge: 2015-01-25 | Disposition: A | Discharge status: Home / Self Care | Payer: Federal, State, Local not specified - PPO | Source: Ambulatory Visit | Attending: Obstetrics | Admitting: Obstetrics

## 2015-01-25 ENCOUNTER — Encounter (HOSPITAL_COMMUNITY): Payer: Self-pay

## 2015-01-25 ENCOUNTER — Encounter (HOSPITAL_COMMUNITY)
Admission: RE | Disposition: A | Discharge status: Home / Self Care | Payer: Self-pay | Source: Ambulatory Visit | Attending: Obstetrics

## 2015-01-25 ENCOUNTER — Ambulatory Visit (HOSPITAL_COMMUNITY): Payer: Federal, State, Local not specified - PPO | Admitting: Anesthesiology

## 2015-01-25 ENCOUNTER — Other Ambulatory Visit: Payer: Self-pay | Admitting: Obstetrics

## 2015-01-25 DIAGNOSIS — T8332XA Displacement of intrauterine contraceptive device, initial encounter: Secondary | ICD-10-CM | POA: Insufficient documentation

## 2015-01-25 DIAGNOSIS — Z30432 Encounter for removal of intrauterine contraceptive device: Secondary | ICD-10-CM

## 2015-01-25 DIAGNOSIS — T8332XD Displacement of intrauterine contraceptive device, subsequent encounter: Secondary | ICD-10-CM | POA: Diagnosis not present

## 2015-01-25 DIAGNOSIS — N939 Abnormal uterine and vaginal bleeding, unspecified: Secondary | ICD-10-CM | POA: Diagnosis not present

## 2015-01-25 DIAGNOSIS — Z975 Presence of (intrauterine) contraceptive device: Secondary | ICD-10-CM | POA: Diagnosis not present

## 2015-01-25 HISTORY — PX: IUD REMOVAL: SHX5392

## 2015-01-25 HISTORY — PX: HYSTEROSCOPY: SHX211

## 2015-01-25 LAB — CBC
HEMATOCRIT: 30 % — AB (ref 36.0–46.0)
Hemoglobin: 10.1 g/dL — ABNORMAL LOW (ref 12.0–15.0)
MCH: 25.9 pg — AB (ref 26.0–34.0)
MCHC: 33.7 g/dL (ref 30.0–36.0)
MCV: 76.9 fL — AB (ref 78.0–100.0)
Platelets: 343 10*3/uL (ref 150–400)
RBC: 3.9 MIL/uL (ref 3.87–5.11)
RDW: 14.3 % (ref 11.5–15.5)
WBC: 5.1 10*3/uL (ref 4.0–10.5)

## 2015-01-25 SURGERY — REMOVAL, INTRAUTERINE DEVICE
Anesthesia: General

## 2015-01-25 MED ORDER — FENTANYL CITRATE (PF) 100 MCG/2ML IJ SOLN: 25.0000 ug | INTRAMUSCULAR | Status: DC | PRN

## 2015-01-25 MED ORDER — SCOPOLAMINE 1 MG/3DAYS TD PT72
MEDICATED_PATCH | TRANSDERMAL | Status: AC
  Filled 2015-01-25: qty 1

## 2015-01-25 MED ORDER — PROPOFOL 10 MG/ML IV BOLUS
INTRAVENOUS | Status: AC
Start: 2015-01-25 — End: 2015-01-25
  Filled 2015-01-25: qty 20

## 2015-01-25 MED ORDER — LIDOCAINE HCL (CARDIAC) 20 MG/ML IV SOLN
INTRAVENOUS | Status: DC | PRN
  Administered 2015-01-25: 100 mg via INTRAVENOUS

## 2015-01-25 MED ORDER — LIDOCAINE HCL 1 % IJ SOLN
INTRAMUSCULAR | Status: AC
  Filled 2015-01-25: qty 20

## 2015-01-25 MED ORDER — DEXAMETHASONE SODIUM PHOSPHATE 4 MG/ML IJ SOLN
INTRAMUSCULAR | Status: AC
  Filled 2015-01-25: qty 1

## 2015-01-25 MED ORDER — LIDOCAINE HCL (CARDIAC) 20 MG/ML IV SOLN
INTRAVENOUS | Status: AC
  Filled 2015-01-25: qty 5

## 2015-01-25 MED ORDER — FENTANYL CITRATE (PF) 250 MCG/5ML IJ SOLN
INTRAMUSCULAR | Status: AC
  Filled 2015-01-25: qty 25

## 2015-01-25 MED ORDER — PHENYLEPHRINE 40 MCG/ML (10ML) SYRINGE FOR IV PUSH (FOR BLOOD PRESSURE SUPPORT)
PREFILLED_SYRINGE | INTRAVENOUS | Status: AC
  Filled 2015-01-25: qty 10

## 2015-01-25 MED ORDER — HYDROCODONE-ACETAMINOPHEN 7.5-325 MG PO TABS: 1.0000 | ORAL_TABLET | Freq: Once | ORAL | Status: DC | PRN

## 2015-01-25 MED ORDER — LIDOCAINE HCL 1 % IJ SOLN
INTRAMUSCULAR | Status: DC | PRN
  Administered 2015-01-25: 20 mL

## 2015-01-25 MED ORDER — MIDAZOLAM HCL 2 MG/2ML IJ SOLN
INTRAMUSCULAR | Status: DC | PRN
  Administered 2015-01-25: 1 mg via INTRAVENOUS

## 2015-01-25 MED ORDER — KETOROLAC TROMETHAMINE 30 MG/ML IJ SOLN
INTRAMUSCULAR | Status: DC | PRN
  Administered 2015-01-25: 30 mg via INTRAVENOUS

## 2015-01-25 MED ORDER — ONDANSETRON HCL 4 MG/2ML IJ SOLN
INTRAMUSCULAR | Status: DC | PRN
  Administered 2015-01-25: 4 mg via INTRAVENOUS

## 2015-01-25 MED ORDER — MIDAZOLAM HCL 2 MG/2ML IJ SOLN
INTRAMUSCULAR | Status: AC
  Filled 2015-01-25: qty 4

## 2015-01-25 MED ORDER — PHENYLEPHRINE HCL 10 MG/ML IJ SOLN
INTRAMUSCULAR | Status: DC | PRN
  Administered 2015-01-25: 80 ug via INTRAVENOUS
  Administered 2015-01-25: 40 ug via INTRAVENOUS
  Administered 2015-01-25: 80 ug via INTRAVENOUS

## 2015-01-25 MED ORDER — LACTATED RINGERS IV SOLN
INTRAVENOUS | Status: DC
  Administered 2015-01-25 (×2): via INTRAVENOUS

## 2015-01-25 MED ORDER — MEPERIDINE HCL 25 MG/ML IJ SOLN: 6.2500 mg | INTRAMUSCULAR | Status: DC | PRN

## 2015-01-25 MED ORDER — SCOPOLAMINE 1 MG/3DAYS TD PT72
1.0000 | MEDICATED_PATCH | TRANSDERMAL | Status: DC
  Administered 2015-01-25: 1.5 mg via TRANSDERMAL

## 2015-01-25 MED ORDER — KETOROLAC TROMETHAMINE 30 MG/ML IJ SOLN
INTRAMUSCULAR | Status: AC
  Filled 2015-01-25: qty 1

## 2015-01-25 MED ORDER — PROPOFOL 10 MG/ML IV BOLUS
INTRAVENOUS | Status: DC | PRN
  Administered 2015-01-25: 150 mg via INTRAVENOUS

## 2015-01-25 MED ORDER — IBUPROFEN 800 MG PO TABS: 800.0000 mg | ORAL_TABLET | Freq: Three times a day (TID) | ORAL | Status: DC | PRN

## 2015-01-25 MED ORDER — ONDANSETRON HCL 4 MG/2ML IJ SOLN
INTRAMUSCULAR | Status: AC
  Filled 2015-01-25: qty 2

## 2015-01-25 MED ORDER — METOCLOPRAMIDE HCL 5 MG/ML IJ SOLN: 10.0000 mg | Freq: Once | INTRAMUSCULAR | Status: DC | PRN

## 2015-01-25 MED ORDER — FENTANYL CITRATE (PF) 100 MCG/2ML IJ SOLN
INTRAMUSCULAR | Status: DC | PRN
  Administered 2015-01-25: 50 ug via INTRAVENOUS
  Administered 2015-01-25: 100 ug via INTRAVENOUS

## 2015-01-25 MED ORDER — DEXAMETHASONE SODIUM PHOSPHATE 10 MG/ML IJ SOLN
INTRAMUSCULAR | Status: DC | PRN
  Administered 2015-01-25: 4 mg via INTRAVENOUS

## 2015-01-25 SURGICAL SUPPLY — 17 items
CANISTER SUCT 3000ML (MISCELLANEOUS) ×2 IMPLANT
CATH ROBINSON RED A/P 16FR (CATHETERS) ×2 IMPLANT
CLOTH BEACON ORANGE TIMEOUT ST (SAFETY) ×2 IMPLANT
CONTAINER PREFILL 10% NBF 60ML (FORM) ×4 IMPLANT
ELECT REM PT RETURN 9FT ADLT (ELECTROSURGICAL)
ELECTRODE REM PT RTRN 9FT ADLT (ELECTROSURGICAL) IMPLANT
GLOVE BIO SURGEON STRL SZ8 (GLOVE) ×4 IMPLANT
GOWN STRL REUS W/TWL LRG LVL3 (GOWN DISPOSABLE) ×4 IMPLANT
LOOP ANGLED CUTTING 22FR (CUTTING LOOP) IMPLANT
NEEDLE HYPO 25X5/8 SAFETYGLIDE (NEEDLE) IMPLANT
NEEDLE SPNL 20GX3.5 QUINCKE YW (NEEDLE) IMPLANT
PACK VAGINAL MINOR WOMEN LF (CUSTOM PROCEDURE TRAY) ×2 IMPLANT
PAD OB MATERNITY 4.3X12.25 (PERSONAL CARE ITEMS) ×2 IMPLANT
TOWEL OR 17X24 6PK STRL BLUE (TOWEL DISPOSABLE) ×4 IMPLANT
TUBING AQUILEX INFLOW (TUBING) ×2 IMPLANT
TUBING AQUILEX OUTFLOW (TUBING) ×2 IMPLANT
WATER STERILE IRR 1000ML POUR (IV SOLUTION) ×2 IMPLANT

## 2015-01-25 NOTE — Op Note (Signed)
Preoperative diagnosis: Attempted IUD Removal, Unsuccessful  Postoperative diagnosis: Same  Procedure: Diagnostic hysteroscopy, Removal of IUD  Surgeon: Baltazar Najjar A.  Anesthesia:general  Estimated blood loss: 40ml  Urine output: 44ml  IV Fluids: 224SL  Complications: None  Specimen: Mirena IUD  Operative Findings: Malpositioned IUD in lower uterine segment.  Description of procedure:   The patient was taken to the operating room and placed on the operating table in the semi-lithotomy position in [place].  Examination under anesthesia was performed.  The patient was prepped and draped in the usual manner.  After a time-out had been completed, a speculum was placed in the vagina.  The anterior lip of the cervix was grasped with a single-toothed tenaculum.    10 cc of 1% lidocaine were injected at 4 and 7 o'clock to produce a paracervical block.  The uterine cavity sounded to 7 cm.  The endocervical canal was dilated with Kennon Rounds dilators.  A 5 mm diagnostic hysteroscope with Glycine as the distending medium was used to perform a diagnostic hysteroscopy.  The findings are noted above.    The hysteroscope was removed.  A polyp forceps was then advanced into the uterine cavity and the IUD was grasped and removed intact. .  All the instruments were removed from the vagina.  Final instrument counts were correct.  The patient was taken to the PACU in stable condition.

## 2015-01-25 NOTE — Transfer of Care (Signed)
Immediate Anesthesia Transfer of Care Note  Patient: Victoria Mejia  Procedure(s) Performed: Procedure(s): INTRAUTERINE DEVICE (IUD) REMOVAL (N/A) HYSTEROSCOPY (N/A)  Patient Location: PACU  Anesthesia Type:General  Level of Consciousness: awake  Airway & Oxygen Therapy: Patient Spontanous Breathing  Post-op Assessment: Report given to PACU RN  Post vital signs: stable  Filed Vitals:   01/25/15 0721  BP: 136/86  Pulse: 84  Temp: 36.9 C  Resp: 20    Complications: No apparent anesthesia complications

## 2015-01-25 NOTE — Anesthesia Procedure Notes (Signed)
Procedure Name: LMA Insertion Date/Time: 01/25/2015 8:29 AM Performed by: Casimer Lanius A Pre-anesthesia Checklist: Patient being monitored, Patient identified, Emergency Drugs available and Suction available Patient Re-evaluated:Patient Re-evaluated prior to inductionOxygen Delivery Method: Circle system utilized Preoxygenation: Pre-oxygenation with 100% oxygen Intubation Type: IV induction and Inhalational induction Ventilation: Mask ventilation without difficulty LMA: LMA inserted LMA Size: 4.0 Number of attempts: 1 Dental Injury: Teeth and Oropharynx as per pre-operative assessment

## 2015-01-25 NOTE — H&P (Signed)
Victoria Mejia is an 44 y.o. female. Presents for hysteroscopy and removal of IUD.  Unable to remove IUD in office.  Pertinent Gynecological History: Menses: flow is heavy Bleeding: none Contraception: IUD DES exposure: denies Blood transfusions: none Sexually transmitted diseases: no past history Previous GYN Procedures: none  Last mammogram: normal Date: 2016 Last pap: normal Date: 2016   Menstrual History: Menarche age: 5  No LMP recorded. Patient is not currently having periods (Reason: IUD).    Past Medical History  Diagnosis Date  . Hypertension   . Hyperlipidemia   . History of chicken pox     Past Surgical History  Procedure Laterality Date  . Cesarean section  2005    Family History  Problem Relation Age of Onset  . Hypertension Father   . Alcohol abuse Father   . Stroke Maternal Grandmother   . Arthritis Mother   . Hyperlipidemia Mother   . Heart disease Maternal Uncle 68    Social History:  reports that she has never smoked. She does not have any smokeless tobacco history on file. She reports that she drinks alcohol. She reports that she does not use illicit drugs.  Allergies: No Known Allergies   (Not in a hospital admission)  Review of Systems  All other systems reviewed and are negative.   There were no vitals taken for this visit. Physical Exam  Nursing note and vitals reviewed. Constitutional: She is oriented to person, place, and time. She appears well-developed and well-nourished.  HENT:  Head: Normocephalic and atraumatic.  Eyes: Conjunctivae are normal. Pupils are equal, round, and reactive to light.  Neck: Normal range of motion. Neck supple.  Cardiovascular: Normal rate and regular rhythm.   Respiratory: Effort normal and breath sounds normal.  GI: Soft. Bowel sounds are normal.  Genitourinary: Vagina normal and uterus normal.  Musculoskeletal: Normal range of motion.  Neurological: She is alert and oriented to person, place,  and time.  Skin: Skin is warm and dry.  Psychiatric: She has a normal mood and affect. Her behavior is normal. Thought content normal.    No results found for this or any previous visit (from the past 24 hour(s)).  No results found.  Assessment/Plan: Unsuccessful IUD removal in office.  Plan hysteroscopy and removal of IUD.  HARPER,CHARLES A 01/25/2015, 6:58 AM

## 2015-01-25 NOTE — Anesthesia Postprocedure Evaluation (Signed)
  Anesthesia Post-op Note  Patient: Victoria Mejia  Procedure(s) Performed: Procedure(s): INTRAUTERINE DEVICE (IUD) REMOVAL (N/A) HYSTEROSCOPY (N/A)  Patient Location: PACU  Anesthesia Type:General  Level of Consciousness: awake, alert  and oriented  Airway and Oxygen Therapy: Patient Spontanous Breathing  Post-op Pain: none  Post-op Assessment: Post-op Vital signs reviewed, Patient's Cardiovascular Status Stable, Respiratory Function Stable, Patent Airway, No signs of Nausea or vomiting and Pain level controlled              Post-op Vital Signs: Reviewed and stable  Last Vitals:  Filed Vitals:   01/25/15 0945  BP: 134/90  Pulse: 78  Temp:   Resp: 20    Complications: No apparent anesthesia complications

## 2015-01-25 NOTE — Discharge Instructions (Signed)
DISCHARGE INSTRUCTIONS: HYSTEROSCOPY / ENDOMETRIAL ABLATION The following instructions have been prepared to help you care for yourself upon your return home.  May Remove Scop patch on or before 01/27/2015.  May take Ibuprofen after 2:45 pm as needed for cramps.   Personal hygiene:  Use sanitary pads for vaginal drainage, not tampons.  Shower the day after your procedure.  NO tub baths, pools or Jacuzzis for 2-3 weeks.  Wipe front to back after using the bathroom.  Activity and limitations:  Do NOT drive or operate any equipment for 24 hours. The effects of anesthesia are still present and drowsiness may result.  Do NOT rest in bed all day.  Walking is encouraged.  Walk up and down stairs slowly.  You may resume your normal activity in one to two days or as indicated by your physician.  Sexual activity: NO intercourse for at least 2 weeks after the procedure, or as indicated by your Doctor.  Diet: Eat a light meal as desired this evening. You may resume your usual diet tomorrow.  Return to Work: You may resume your work activities in one to two days or as indicated by Marine scientist.  What to expect after your surgery: Expect to have vaginal bleeding/discharge for 2-3 days and spotting for up to 10 days. It is not unusual to have soreness for up to 1-2 weeks. You may have a slight burning sensation when you urinate for the first day. Mild cramps may continue for a couple of days. You may have a regular period in 2-6 weeks.  Call your doctor for any of the following:  Excessive vaginal bleeding or clotting, saturating and changing one pad every hour.  Inability to urinate 6 hours after discharge from hospital.  Pain not relieved by pain medication.  Fever of 100.4 F or greater.  Unusual vaginal discharge or odor.

## 2015-01-26 ENCOUNTER — Encounter (HOSPITAL_COMMUNITY): Payer: Self-pay | Admitting: Obstetrics

## 2015-02-13 ENCOUNTER — Encounter: Payer: Self-pay | Admitting: Obstetrics

## 2015-02-13 ENCOUNTER — Ambulatory Visit (INDEPENDENT_AMBULATORY_CARE_PROVIDER_SITE_OTHER): Payer: Federal, State, Local not specified - PPO | Admitting: Obstetrics

## 2015-02-13 VITALS — BP 129/90 | HR 119 | Temp 98.2°F | Wt 171.0 lb

## 2015-02-13 DIAGNOSIS — Z30019 Encounter for initial prescription of contraceptives, unspecified: Secondary | ICD-10-CM

## 2015-02-13 DIAGNOSIS — Z3009 Encounter for other general counseling and advice on contraception: Secondary | ICD-10-CM | POA: Diagnosis not present

## 2015-02-13 MED ORDER — ETONOGESTREL-ETHINYL ESTRADIOL 0.12-0.015 MG/24HR VA RING
VAGINAL_RING | VAGINAL | Status: DC
Start: 2015-02-13 — End: 2016-03-08

## 2015-02-13 NOTE — Progress Notes (Signed)
Subjective:    Victoria Mejia is a 44 y.o. female who presents for contraception counseling. The patient has no complaints today. The patient is sexually active. Pertinent past medical history: none.  The information documented in the HPI was reviewed and verified.  Menstrual History: OB History    No data available       Patient's last menstrual period was 02/12/2015.   Patient Active Problem List   Diagnosis Date Noted  . Preventative health care 11/09/2014  . HTN (hypertension) 09/27/2014  . Atypical chest pain 09/27/2014  . Hyperlipidemia 09/27/2014   Past Medical History  Diagnosis Date  . Hypertension   . Hyperlipidemia   . History of chicken pox     Past Surgical History  Procedure Laterality Date  . Cesarean section  2005  . Iud removal N/A 01/25/2015    Procedure: INTRAUTERINE DEVICE (IUD) REMOVAL;  Surgeon: Shelly Bombard, MD;  Location: [facility];  Service: Gynecology;  Laterality: N/A;  . Hysteroscopy N/A 01/25/2015    Procedure: HYSTEROSCOPY;  Surgeon: Shelly Bombard, MD;  Location: [facility];  Service: Gynecology;  Laterality: N/A;     Current outpatient prescriptions:  .  hydrochlorothiazide (HYDRODIURIL) 25 MG tablet, Take 1 tablet (25 mg total) by mouth daily., Disp: 90 tablet, Rfl: 1 .  ibuprofen (ADVIL,MOTRIN) 800 MG tablet, Take 1 tablet (800 mg total) by mouth every 8 (eight) hours as needed., Disp: 30 tablet, Rfl: 5 .  etonogestrel-ethinyl estradiol (NUVARING) 0.12-0.015 MG/24HR vaginal ring, Insert vaginally and leave in place for 3 consecutive weeks, then remove for 1 week., Disp: 1 each, Rfl: 12 No Known Allergies  Social History  Substance Use Topics  . Smoking status: Never Smoker   . Smokeless tobacco: Not on file  . Alcohol Use: 0.0 oz/week    0 Standard drinks or equivalent per week     Comment: occasional    Family History  Problem Relation Age of Onset  . Hypertension Father   . Alcohol abuse Father   . Stroke Maternal Grandmother    . Arthritis Mother   . Hyperlipidemia Mother   . Heart disease Maternal Uncle 50       Review of Systems Constitutional: negative for weight loss Genitourinary:negative for abnormal menstrual periods and vaginal discharge   Objective:   BP 129/90 mmHg  Pulse 119  Temp(Src) 98.2 F (36.8 C)  Wt 171 lb (77.565 kg)  LMP 02/12/2015  PE:  Deferred   Lab Review Urine pregnancy test Labs reviewed yes Radiologic studies reviewed yes  100% of 10 min visit spent on counseling and coordination of care.   Assessment:    44 y.o., discontinuing IUD because of malposition x 2 after proper insertions. Patient wants to start NuvaRing -  no contraindications.   Plan:   NuvaRing Rx   All questions answered. Contraception: NuvaRing vaginal inserts. Neurosurgeon distributed. Follow up in 4 months.    Meds ordered this encounter  Medications  . etonogestrel-ethinyl estradiol (NUVARING) 0.12-0.015 MG/24HR vaginal ring    Sig: Insert vaginally and leave in place for 3 consecutive weeks, then remove for 1 week.    Dispense:  1 each    Refill:  12   No orders of the defined types were placed in this encounter.

## 2015-05-16 ENCOUNTER — Ambulatory Visit: Payer: Federal, State, Local not specified - PPO | Admitting: Family

## 2015-05-17 ENCOUNTER — Encounter: Payer: Self-pay | Admitting: Family

## 2015-05-17 ENCOUNTER — Telehealth: Payer: Self-pay | Admitting: Family

## 2015-05-17 ENCOUNTER — Ambulatory Visit (INDEPENDENT_AMBULATORY_CARE_PROVIDER_SITE_OTHER): Payer: Federal, State, Local not specified - PPO | Admitting: Family

## 2015-05-17 VITALS — BP 132/82 | HR 79 | Temp 98.7°F | Resp 18 | Ht 63.5 in | Wt 184.0 lb

## 2015-05-17 DIAGNOSIS — I10 Essential (primary) hypertension: Secondary | ICD-10-CM

## 2015-05-17 DIAGNOSIS — Z0000 Encounter for general adult medical examination without abnormal findings: Secondary | ICD-10-CM

## 2015-05-17 DIAGNOSIS — D649 Anemia, unspecified: Secondary | ICD-10-CM

## 2015-05-17 DIAGNOSIS — E785 Hyperlipidemia, unspecified: Secondary | ICD-10-CM

## 2015-05-17 LAB — BASIC METABOLIC PANEL
BUN: 10 mg/dL (ref 6–23)
CHLORIDE: 106 meq/L (ref 96–112)
CO2: 26 mEq/L (ref 19–32)
Calcium: 9.4 mg/dL (ref 8.4–10.5)
Creatinine, Ser: 0.67 mg/dL (ref 0.40–1.20)
GFR: 123.68 mL/min (ref >60.00)
Glucose, Bld: 109 mg/dL — ABNORMAL HIGH (ref 70–99)
POTASSIUM: 3.9 meq/L (ref 3.5–5.1)
SODIUM: 138 meq/L (ref 135–145)

## 2015-05-17 LAB — CBC WITH DIFFERENTIAL/PLATELET
BASOS PCT: 1 % (ref 0.0–3.0)
Basophils Absolute: 0.1 10*3/uL (ref 0.0–0.1)
EOS PCT: 0.6 % (ref 0.0–5.0)
Eosinophils Absolute: 0 10*3/uL (ref 0.0–0.7)
HEMATOCRIT: 31.7 % — AB (ref 36.0–46.0)
HEMOGLOBIN: 9.7 g/dL — AB (ref 12.0–15.0)
Lymphocytes Relative: 33.9 % (ref 12.0–46.0)
Lymphs Abs: 2 10*3/uL (ref 0.7–4.0)
MCHC: 30.7 g/dL (ref 30.0–36.0)
MONOS PCT: 6.5 % (ref 3.0–12.0)
Monocytes Absolute: 0.4 10*3/uL (ref 0.1–1.0)
Neutro Abs: 3.4 10*3/uL (ref 1.4–7.7)
Neutrophils Relative %: 58 % (ref 43.0–77.0)
Platelets: 500 10*3/uL — ABNORMAL HIGH (ref 150.0–400.0)
RBC: 4.94 Mil/uL (ref 3.87–5.11)
RDW: 20.4 % — AB (ref 11.5–15.5)
WBC: 5.8 10*3/uL (ref 4.0–10.5)

## 2015-05-17 LAB — IRON: Iron: 17 ug/dL — ABNORMAL LOW (ref 42–145)

## 2015-05-17 MED ORDER — IRON 325 (65 FE) MG PO TABS: 1.0000 | ORAL_TABLET | Freq: Two times a day (BID) | ORAL | Status: DC

## 2015-05-17 MED ORDER — HYDROCHLOROTHIAZIDE 25 MG PO TABS: 25.0000 mg | ORAL_TABLET | Freq: Every day | ORAL | Status: DC

## 2015-05-17 NOTE — Assessment & Plan Note (Addendum)
Lipids at goal. Cont low cholesterol diet.

## 2015-05-17 NOTE — Assessment & Plan Note (Signed)
Suspect iron deficiency- had iud removed in September.  Obtain cbc, iron level.

## 2015-05-17 NOTE — Assessment & Plan Note (Signed)
BP stable, continue hctz. Obtain follow up bmet.

## 2015-05-17 NOTE — Telephone Encounter (Signed)
Attempted to reach pt and left message to check mychart account.

## 2015-05-17 NOTE — Patient Instructions (Signed)
Please complete lab work prior to leaving. Continue hctz. You may use ibuprofen for a few days as needed for left foot pain.

## 2015-05-17 NOTE — Telephone Encounter (Signed)
Iron is low and she is anemic. Advise pt to start iron 325mg  bid repeat serum iron and cbc in 3 months.

## 2015-05-17 NOTE — Progress Notes (Signed)
Pre visit review using our clinic review tool, if applicable. No additional management support is needed unless otherwise documented below in the visit note. 

## 2015-05-17 NOTE — Progress Notes (Signed)
Subjective:    Patient ID: Victoria Mejia, female    DOB: August 04, 1970, 45 y.o.   MRN: [id number]  HPI  Victoria Mejia is a 45 yr old female who presents today for follow up.  1) HTN- maintained on hctz.  BP Readings from Last 3 Encounters:  05/17/15 132/82  02/13/15 129/90  01/25/15 138/95   2) Hyperlipidemia-not on statin. Lab Results  Component Value Date   CHOL 181 09/27/2014   HDL 51.10 09/27/2014   LDLCALC 113* 09/27/2014   TRIG 83.0 09/27/2014   CHOLHDL 4 09/27/2014   3) Left lateral foot pain- started several weeks ago.  Not bad enough to take any medication.  Notices the most first in the AM.    [facility]emic by [facility].  Had IUD until September.  + Pica- ice    Review of Systems  Respiratory: Negative for shortness of breath.   Cardiovascular: Negative for chest pain and leg swelling.   Past Medical History  Diagnosis Date  . Hypertension   . Hyperlipidemia   . History of chicken pox     Social History   Social History  . Marital Status: Single    Spouse Name: N/A  . Number of Children: N/A  . Years of Education: N/A   Occupational History  . Not on file.   Social History Main Topics  . Smoking status: Never Smoker   . Smokeless tobacco: Not on file  . Alcohol Use: 0.0 oz/week    0 Standard drinks or equivalent per week     Comment: occasional  . Drug Use: No  . Sexual Activity:    Partners: Male    Birth Control/ Protection:    Other Topics Concern  . Not on file   Social History Narrative   Single, has live in boyfriend   Daughter 2005   Works in [employer]   Completed college   No pets   Enjoys television       Past Surgical History  Procedure Laterality Date  . Cesarean section  2005  . Iud removal N/A 01/25/2015    Procedure: INTRAUTERINE DEVICE (IUD) REMOVAL;  Surgeon: Shelly Bombard, MD;  Location: [facility];  Service: Gynecology;  Laterality: N/A;  . Hysteroscopy N/A 01/25/2015    Procedure: HYSTEROSCOPY;   Surgeon: Shelly Bombard, MD;  Location: [facility];  Service: Gynecology;  Laterality: N/A;    Family History  Problem Relation Age of Onset  . Hypertension Father   . Alcohol abuse Father   . Stroke Maternal Grandmother   . Arthritis Mother   . Hyperlipidemia Mother   . Heart disease Maternal Uncle 50    No Known Allergies  Current Outpatient Prescriptions on File Prior to Visit  Medication Sig Dispense Refill  . etonogestrel-ethinyl estradiol (NUVARING) 0.12-0.015 MG/24HR vaginal ring Insert vaginally and leave in place for 3 consecutive weeks, then remove for 1 week. 1 each 12  . ibuprofen (ADVIL,MOTRIN) 800 MG tablet Take 1 tablet (800 mg total) by mouth every 8 (eight) hours as needed. 30 tablet 5   No current facility-administered medications on file prior to visit.    BP 132/82 mmHg  Pulse 79  Temp(Src) 98.7 F (37.1 C) (Oral)  Resp 18  Ht 5' 3.5" (1.613 m)  Wt 184 lb (83.462 kg)  BMI 32.08 kg/m2  SpO2 100%  LMP 05/10/2015       Objective:   Physical Exam  Constitutional: She is oriented  to person, place, and time. She appears well-developed and well-nourished.  HENT:  Head: Normocephalic and atraumatic.  Cardiovascular: Normal rate, regular rhythm and normal heart sounds.   No murmur heard. Pulmonary/Chest: Effort normal and breath sounds normal. No respiratory distress. She has no wheezes.  Musculoskeletal: She exhibits no edema.  Neurological: She is alert and oriented to person, place, and time.  Psychiatric: She has a normal mood and affect. Her behavior is normal. Judgment and thought content normal.          Assessment & Plan:

## 2015-06-09 ENCOUNTER — Encounter: Payer: Self-pay | Admitting: *Deleted

## 2015-06-09 NOTE — Telephone Encounter (Signed)
Pt has not read mychart message. Mailed letter to pt. 

## 2015-06-26 ENCOUNTER — Ambulatory Visit (INDEPENDENT_AMBULATORY_CARE_PROVIDER_SITE_OTHER): Payer: Federal, State, Local not specified - PPO | Admitting: Obstetrics

## 2015-06-26 ENCOUNTER — Encounter: Payer: Self-pay | Admitting: Obstetrics

## 2015-06-26 VITALS — BP 127/87 | HR 96 | Temp 98.1°F | Wt 179.0 lb

## 2015-06-26 DIAGNOSIS — Z3049 Encounter for surveillance of other contraceptives: Secondary | ICD-10-CM

## 2015-06-27 ENCOUNTER — Encounter: Payer: Self-pay | Admitting: Obstetrics

## 2015-06-27 NOTE — Progress Notes (Signed)
Subjective:    Victoria Mejia is a 45 y.o. female who presents for contraception counseling. The patient has complaints of vaginal burning with intercourse.  She does feel that she may scratch herself with NuvaRing insertion. The patient is sexually active. Pertinent past medical history: none.  The information documented in the HPI was reviewed and verified.  Menstrual History: OB History    No data available       Patient's last menstrual period was 06/08/2015.   Patient Active Problem List   Diagnosis Date Noted  . Anemia 05/17/2015  . Preventative health care 11/09/2014  . HTN (hypertension) 09/27/2014  . Atypical chest pain 09/27/2014  . Hyperlipidemia 09/27/2014   Past Medical History  Diagnosis Date  . Hypertension   . Hyperlipidemia   . History of chicken pox     Past Surgical History  Procedure Laterality Date  . Cesarean section  2005  . Iud removal N/A 01/25/2015    Procedure: INTRAUTERINE DEVICE (IUD) REMOVAL;  Surgeon: Shelly Bombard, MD;  Location: [facility];  Service: Gynecology;  Laterality: N/A;  . Hysteroscopy N/A 01/25/2015    Procedure: HYSTEROSCOPY;  Surgeon: Shelly Bombard, MD;  Location: [facility];  Service: Gynecology;  Laterality: N/A;     Current outpatient prescriptions:  .  etonogestrel-ethinyl estradiol (NUVARING) 0.12-0.015 MG/24HR vaginal ring, Insert vaginally and leave in place for 3 consecutive weeks, then remove for 1 week., Disp: 1 each, Rfl: 12 .  Ferrous Sulfate (IRON) 325 (65 Fe) MG TABS, Take 1 tablet by mouth 2 (two) times daily., Disp: 30 each, Rfl: 0 .  hydrochlorothiazide (HYDRODIURIL) 25 MG tablet, Take 1 tablet (25 mg total) by mouth daily., Disp: 90 tablet, Rfl: 1 .  ibuprofen (ADVIL,MOTRIN) 800 MG tablet, Take 1 tablet (800 mg total) by mouth every 8 (eight) hours as needed., Disp: 30 tablet, Rfl: 5 No Known Allergies  Social History  Substance Use Topics  . Smoking status: Never Smoker   . Smokeless tobacco: Not on file   . Alcohol Use: 0.0 oz/week    0 Standard drinks or equivalent per week     Comment: occasional    Family History  Problem Relation Age of Onset  . Hypertension Father   . Alcohol abuse Father   . Stroke Maternal Grandmother   . Arthritis Mother   . Hyperlipidemia Mother   . Heart disease Maternal Uncle 50       Review of Systems Constitutional: negative for weight loss Genitourinary:negative for abnormal menstrual periods and vaginal discharge   Objective:   BP 127/87 mmHg  Pulse 96  Temp(Src) 98.1 F (36.7 C)  Wt 179 lb (81.194 kg)  LMP 06/08/2015   General:   alert  Skin:   no rash or abnormalities  Lungs:   clear to auscultation bilaterally  Heart:   regular rate and rhythm, S1, S2 normal, no murmur, click, rub or gallop  Breasts:   normal without suspicious masses, skin or nipple changes or axillary nodes  Abdomen:  normal findings: no organomegaly, soft, non-tender and no hernia  Pelvis:  External genitalia: normal general appearance Urinary system: urethral meatus normal and bladder without fullness, nontender Vaginal: normal without tenderness, induration or masses Cervix: normal appearance Adnexa: normal bimanual exam Uterus: anteverted and non-tender, normal size   Lab Review Urine pregnancy test Labs reviewed yes Radiologic studies reviewed no    Assessment:    45 y.o., continuing NuvaRing vaginal inserts, no contraindications.   Plan:  Wear a glove with NuvaRing insertion to prevent scratching of vaginal mucosa.   All questions answered. Contraception: NuvaRing vaginal inserts. Follow up as needed.    No orders of the defined types were placed in this encounter.   No orders of the defined types were placed in this encounter.

## 2015-08-25 ENCOUNTER — Encounter: Payer: Self-pay | Admitting: Family

## 2015-08-25 ENCOUNTER — Ambulatory Visit (INDEPENDENT_AMBULATORY_CARE_PROVIDER_SITE_OTHER): Payer: Federal, State, Local not specified - PPO | Admitting: Family

## 2015-08-25 VITALS — BP 130/96 | HR 94 | Temp 98.8°F | Resp 16 | Ht 63.5 in | Wt 181.6 lb

## 2015-08-25 DIAGNOSIS — D508 Other iron deficiency anemias: Secondary | ICD-10-CM | POA: Diagnosis not present

## 2015-08-25 DIAGNOSIS — E785 Hyperlipidemia, unspecified: Secondary | ICD-10-CM | POA: Diagnosis not present

## 2015-08-25 DIAGNOSIS — R739 Hyperglycemia, unspecified: Secondary | ICD-10-CM

## 2015-08-25 DIAGNOSIS — I10 Essential (primary) hypertension: Secondary | ICD-10-CM

## 2015-08-25 LAB — BASIC METABOLIC PANEL
BUN: 12 mg/dL (ref 6–23)
CHLORIDE: 102 meq/L (ref 96–112)
CO2: 27 meq/L (ref 19–32)
CREATININE: 0.77 mg/dL (ref 0.40–1.20)
Calcium: 10 mg/dL (ref 8.4–10.5)
GFR: 105.2 mL/min (ref >60.00)
Glucose, Bld: 112 mg/dL — ABNORMAL HIGH (ref 70–99)
Potassium: 4.3 mEq/L (ref 3.5–5.1)
Sodium: 138 mEq/L (ref 135–145)

## 2015-08-25 LAB — IRON: IRON: 67 ug/dL (ref 42–145)

## 2015-08-25 LAB — CBC WITH DIFFERENTIAL/PLATELET
BASOS PCT: 0.8 % (ref 0.0–3.0)
Basophils Absolute: 0 10*3/uL (ref 0.0–0.1)
EOS PCT: 0.9 % (ref 0.0–5.0)
Eosinophils Absolute: 0.1 10*3/uL (ref 0.0–0.7)
HEMATOCRIT: 38.8 % (ref 36.0–46.0)
HEMOGLOBIN: 12.7 g/dL (ref 12.0–15.0)
Lymphocytes Relative: 30.9 % (ref 12.0–46.0)
Lymphs Abs: 1.9 10*3/uL (ref 0.7–4.0)
MCHC: 32.8 g/dL (ref 30.0–36.0)
MCV: 71.4 fl — ABNORMAL LOW (ref 78.0–100.0)
MONO ABS: 0.4 10*3/uL (ref 0.1–1.0)
Monocytes Relative: 5.9 % (ref 3.0–12.0)
NEUTROS ABS: 3.8 10*3/uL (ref 1.4–7.7)
Neutrophils Relative %: 61.5 % (ref 43.0–77.0)
PLATELETS: 452 10*3/uL — AB (ref 150.0–400.0)
RBC: 5.44 Mil/uL — ABNORMAL HIGH (ref 3.87–5.11)
RDW: 24.7 % — AB (ref 11.5–15.5)
WBC: 6.1 10*3/uL (ref 4.0–10.5)

## 2015-08-25 LAB — FERRITIN: Ferritin: 23.5 ng/mL (ref 10.0–291.0)

## 2015-08-25 LAB — HEMOGLOBIN A1C: HEMOGLOBIN A1C: 6 % (ref 4.6–6.5)

## 2015-08-25 NOTE — Assessment & Plan Note (Signed)
Discussed use of colace prn constipation on the iron. Continue iron, obtain follow up iron studies and cbc with diff.

## 2015-08-25 NOTE — Assessment & Plan Note (Signed)
DBP elevated today- did not take HCTZ yet this AM. Will plan to repeat in 3 months, she is advised to take HCTZ dose prior to next appointment.

## 2015-08-25 NOTE — Progress Notes (Signed)
Pre visit review using our clinic review tool, if applicable. No additional management support is needed unless otherwise documented below in the visit note. 

## 2015-08-25 NOTE — Patient Instructions (Signed)
Please complete lab work prior to leaving. Follow up in 3 months.  

## 2015-08-25 NOTE — Progress Notes (Signed)
Subjective:    Patient ID: Victoria Mejia, female    DOB: [birth year], 45 y.o.   MRN: [id number]  HPI  [name] is a 45 yr old female who presents today for follow up.  1) HTN- Maintained on hctz. Reports good compliance with medication.   BP Readings from Last 3 Encounters:  08/25/15 130/96  06/26/15 127/87  05/17/15 132/82   2) Hyperlipidemia- last lipid panel was at goal.  Lab Results  Component Value Date   CHOL 181 09/27/2014   HDL 51.10 09/27/2014   LDLCALC 113* 09/27/2014   TRIG 83.0 09/27/2014   CHOLHDL 4 09/27/2014    3) anemia- She is maintained on iron. No longer eating ice.  Mild constipation on iron.   Lab Results  Component Value Date   WBC 5.8 05/17/2015   HGB 9.7* 05/17/2015   HCT 31.7* 05/17/2015   MCV 64.2 Repeated and verified X2.* 05/17/2015   PLT 500.0* 05/17/2015     Review of Systems See HPI  Past Medical History  Diagnosis Date  . Hypertension   . Hyperlipidemia   . History of chicken pox      Social History   Social History  . Marital Status: Single    Spouse Name: N/A  . Number of Children: N/A  . Years of Education: N/A   Occupational History  . Not on file.   Social History Main Topics  . Smoking status: Never Smoker   . Smokeless tobacco: Not on file  . Alcohol Use: 0.0 oz/week    0 Standard drinks or equivalent per week     Comment: occasional  . Drug Use: No  . Sexual Activity:    Partners: Male    Birth Control/ Protection: Inserts   Other Topics Concern  . Not on file   Social History Narrative   Single, has live in boyfriend   Daughter 2005   Works in [employer]   Completed college   No pets   Enjoys television       Past Surgical History  Procedure Laterality Date  . Cesarean section  2005  . Iud removal N/A 01/25/2015    Procedure: INTRAUTERINE DEVICE (IUD) REMOVAL;  Surgeon: Shelly Bombard, MD;  Location: [facility];  Service: Gynecology;  Laterality: N/A;  . Hysteroscopy N/A 01/25/2015   Procedure: HYSTEROSCOPY;  Surgeon: Shelly Bombard, MD;  Location: [facility];  Service: Gynecology;  Laterality: N/A;    Family History  Problem Relation Age of Onset  . Hypertension Father   . Alcohol abuse Father   . Stroke Maternal Grandmother   . Arthritis Mother   . Hyperlipidemia Mother   . Heart disease Maternal Uncle 50    No Known Allergies  Current Outpatient Prescriptions on File Prior to Visit  Medication Sig Dispense Refill  . etonogestrel-ethinyl estradiol (NUVARING) 0.12-0.015 MG/24HR vaginal ring Insert vaginally and leave in place for 3 consecutive weeks, then remove for 1 week. 1 each 12  . Ferrous Sulfate (IRON) 325 (65 Fe) MG TABS Take 1 tablet by mouth 2 (two) times daily. 30 each 0  . hydrochlorothiazide (HYDRODIURIL) 25 MG tablet Take 1 tablet (25 mg total) by mouth daily. 90 tablet 1  . ibuprofen (ADVIL,MOTRIN) 800 MG tablet Take 1 tablet (800 mg total) by mouth every 8 (eight) hours as needed. 30 tablet 5   No current facility-administered medications on file prior to visit.    BP 130/96 mmHg  Pulse 94  Temp(Src) 98.8 F (37.1  C) (Oral)  Resp 16  Ht 5' 3.5" (1.613 m)  Wt 181 lb 9.6 oz (82.373 kg)  BMI 31.66 kg/m2  SpO2 100%  LMP 08/15/2015       Objective:   Physical Exam  Constitutional: She is oriented to person, place, and time. She appears well-developed and well-nourished.  HENT:  Head: Normocephalic and atraumatic.  Cardiovascular: Normal rate, regular rhythm and normal heart sounds.   No murmur heard. Pulmonary/Chest: Effort normal and breath sounds normal. No respiratory distress. She has no wheezes.  Musculoskeletal: She exhibits no edema.  Neurological: She is alert and oriented to person, place, and time.  Psychiatric: She has a normal mood and affect. Her behavior is normal. Judgment and thought content normal.          Assessment & Plan:

## 2015-08-25 NOTE — Assessment & Plan Note (Signed)
Last lipid panel was at goal.

## 2015-08-28 ENCOUNTER — Encounter: Payer: Self-pay | Admitting: Family

## 2015-08-28 DIAGNOSIS — E118 Type 2 diabetes mellitus with unspecified complications: Secondary | ICD-10-CM | POA: Insufficient documentation

## 2015-08-28 DIAGNOSIS — R739 Hyperglycemia, unspecified: Secondary | ICD-10-CM | POA: Insufficient documentation

## 2015-09-11 ENCOUNTER — Encounter: Payer: Self-pay | Admitting: Family

## 2015-10-09 ENCOUNTER — Encounter: Payer: Self-pay | Admitting: Obstetrics

## 2015-10-09 ENCOUNTER — Encounter: Payer: Self-pay | Admitting: Family

## 2015-10-09 ENCOUNTER — Ambulatory Visit (INDEPENDENT_AMBULATORY_CARE_PROVIDER_SITE_OTHER): Payer: Federal, State, Local not specified - PPO | Admitting: Obstetrics

## 2015-10-09 VITALS — BP 146/95 | HR 77 | Temp 98.8°F | Wt 188.0 lb

## 2015-10-09 DIAGNOSIS — Z01419 Encounter for gynecological examination (general) (routine) without abnormal findings: Secondary | ICD-10-CM

## 2015-10-09 DIAGNOSIS — Z1389 Encounter for screening for other disorder: Secondary | ICD-10-CM | POA: Diagnosis not present

## 2015-10-09 LAB — POCT URINALYSIS DIPSTICK
Bilirubin, UA: NEGATIVE
Glucose, UA: NEGATIVE
Ketones, UA: NEGATIVE
Leukocytes, UA: NEGATIVE
Nitrite, UA: NEGATIVE
Protein, UA: NEGATIVE
Spec Grav, UA: 1.015
Urobilinogen, UA: NEGATIVE
pH, UA: 5

## 2015-10-12 LAB — PAP IG AND HPV HIGH-RISK
HPV, HIGH-RISK: NEGATIVE
PAP SMEAR COMMENT: 0

## 2015-10-13 ENCOUNTER — Encounter: Payer: Self-pay | Admitting: Obstetrics

## 2015-10-13 NOTE — Progress Notes (Signed)
Subjective:        Victoria Mejia is a 45 y.o. female here for a routine exam.  Current complaints: None.    Personal health questionnaire:  Is patient Ashkenazi Jewish, have a family history of breast and/or ovarian cancer: no Is there a family history of uterine cancer diagnosed at age < 17, gastrointestinal cancer, urinary tract cancer, family member who is a Field seismologist syndrome-associated carrier: no Is the patient overweight and hypertensive, family history of diabetes, personal history of gestational diabetes, preeclampsia or PCOS: no Is patient over 86, have PCOS,  family history of premature CHD under age 38, diabetes, smoke, have hypertension or peripheral artery disease:  no At any time, has a partner hit, kicked or otherwise hurt or frightened you?: no Over the past 2 weeks, have you felt down, depressed or hopeless?: no Over the past 2 weeks, have you felt little interest or pleasure in doing things?:no   Gynecologic History No LMP recorded. Contraception: NuvaRing vaginal inserts Last Pap: 2016. Results were: normal Last mammogram: 2016. Results were: normal  Obstetric History OB History  No data available    Past Medical History  Diagnosis Date  . Hypertension   . Hyperlipidemia   . History of chicken pox     Past Surgical History  Procedure Laterality Date  . Cesarean section  2005  . Iud removal N/A 01/25/2015    Procedure: INTRAUTERINE DEVICE (IUD) REMOVAL;  Surgeon: Shelly Bombard, MD;  Location: [facility];  Service: Gynecology;  Laterality: N/A;  . Hysteroscopy N/A 01/25/2015    Procedure: HYSTEROSCOPY;  Surgeon: Shelly Bombard, MD;  Location: [facility];  Service: Gynecology;  Laterality: N/A;     Current outpatient prescriptions:  .  etonogestrel-ethinyl estradiol (NUVARING) 0.12-0.015 MG/24HR vaginal ring, Insert vaginally and leave in place for 3 consecutive weeks, then remove for 1 week., Disp: 1 each, Rfl: 12 .  Ferrous Sulfate (IRON) 325 (65 Fe) MG  TABS, Take 1 tablet by mouth 2 (two) times daily., Disp: 30 each, Rfl: 0 .  hydrochlorothiazide (HYDRODIURIL) 25 MG tablet, Take 1 tablet (25 mg total) by mouth daily., Disp: 90 tablet, Rfl: 1 .  ibuprofen (ADVIL,MOTRIN) 800 MG tablet, Take 1 tablet (800 mg total) by mouth every 8 (eight) hours as needed., Disp: 30 tablet, Rfl: 5 No Known Allergies  Social History  Substance Use Topics  . Smoking status: Never Smoker   . Smokeless tobacco: Not on file  . Alcohol Use: 0.0 oz/week    0 Standard drinks or equivalent per week     Comment: occasional    Family History  Problem Relation Age of Onset  . Hypertension Father   . Alcohol abuse Father   . Stroke Maternal Grandmother   . Arthritis Mother   . Hyperlipidemia Mother   . Heart disease Maternal Uncle 50      Review of Systems  Constitutional: negative for fatigue and weight loss Respiratory: negative for cough and wheezing Cardiovascular: negative for chest pain, fatigue and palpitations Gastrointestinal: negative for abdominal pain and change in bowel habits Musculoskeletal:negative for myalgias Neurological: negative for gait problems and tremors Behavioral/Psych: negative for abusive relationship, depression Endocrine: negative for temperature intolerance   Genitourinary:negative for abnormal menstrual periods, genital lesions, hot flashes, sexual problems and vaginal discharge Integument/breast: negative for breast lump, breast tenderness, nipple discharge and skin lesion(s)    Objective:       BP 146/95 mmHg  Pulse 77  Temp(Src) 98.8 F (37.1 C)  Wt 188 lb (85.276 kg) General:   alert  Skin:   no rash or abnormalities  Lungs:   clear to auscultation bilaterally  Heart:   regular rate and rhythm, S1, S2 normal, no murmur, click, rub or gallop  Breasts:   normal without suspicious masses, skin or nipple changes or axillary nodes  Abdomen:  normal findings: no organomegaly, soft, non-tender and no hernia  Pelvis:   External genitalia: normal general appearance Urinary system: urethral meatus normal and bladder without fullness, nontender Vaginal: normal without tenderness, induration or masses Cervix: normal appearance Adnexa: normal bimanual exam Uterus: anteverted and non-tender, normal size   Lab Review Urine pregnancy test Labs reviewed yes Radiologic studies reviewed yes  Assessment:    Healthy female exam.    Contraceptive surveillance.   Pleased with NuvaRing.   Plan:    Education reviewed: calcium supplements, depression evaluation, low fat, low cholesterol diet, safe sex/STD prevention, self breast exams and weight bearing exercise. Contraception: NuvaRing vaginal inserts. Mammogram ordered. Follow up in: 1 year.   No orders of the defined types were placed in this encounter.   Orders Placed This Encounter  Procedures  . POCT urinalysis dipstick

## 2015-10-14 LAB — NUSWAB VG+, CANDIDA 6SP
CANDIDA KRUSEI, NAA: NEGATIVE
CANDIDA PARAPSILOSIS, NAA: NEGATIVE
Candida albicans, NAA: NEGATIVE
Candida glabrata, NAA: NEGATIVE
Candida lusitaniae, NAA: NEGATIVE
Candida tropicalis, NAA: NEGATIVE
Chlamydia trachomatis, NAA: NEGATIVE
Neisseria gonorrhoeae, NAA: NEGATIVE
TRICH VAG BY NAA: NEGATIVE

## 2015-10-19 ENCOUNTER — Encounter: Payer: Self-pay | Admitting: Obstetrics

## 2015-11-18 ENCOUNTER — Other Ambulatory Visit: Payer: Self-pay | Admitting: Obstetrics

## 2015-11-23 ENCOUNTER — Ambulatory Visit: Payer: Federal, State, Local not specified - PPO | Admitting: Family

## 2015-12-04 ENCOUNTER — Encounter: Payer: Self-pay | Admitting: Family

## 2015-12-04 ENCOUNTER — Ambulatory Visit (INDEPENDENT_AMBULATORY_CARE_PROVIDER_SITE_OTHER): Payer: Federal, State, Local not specified - PPO | Admitting: Family

## 2015-12-04 VITALS — BP 136/88 | HR 109 | Temp 98.8°F | Resp 16 | Ht 63.5 in | Wt 189.6 lb

## 2015-12-04 DIAGNOSIS — Z0000 Encounter for general adult medical examination without abnormal findings: Secondary | ICD-10-CM | POA: Diagnosis not present

## 2015-12-04 DIAGNOSIS — D509 Iron deficiency anemia, unspecified: Secondary | ICD-10-CM | POA: Diagnosis not present

## 2015-12-04 DIAGNOSIS — I10 Essential (primary) hypertension: Secondary | ICD-10-CM

## 2015-12-04 DIAGNOSIS — D649 Anemia, unspecified: Secondary | ICD-10-CM

## 2015-12-04 DIAGNOSIS — M1990 Unspecified osteoarthritis, unspecified site: Secondary | ICD-10-CM | POA: Insufficient documentation

## 2015-12-04 DIAGNOSIS — E785 Hyperlipidemia, unspecified: Secondary | ICD-10-CM | POA: Diagnosis not present

## 2015-12-04 NOTE — Progress Notes (Signed)
Pre visit review using our clinic review tool, if applicable. No additional management support is needed unless otherwise documented below in the visit note. 

## 2015-12-04 NOTE — Assessment & Plan Note (Signed)
BP looks better, continue hctz.

## 2015-12-04 NOTE — Assessment & Plan Note (Signed)
Last lipids stable. Plan to repeat lipids at [facility].

## 2015-12-04 NOTE — Patient Instructions (Signed)
For ankle pain- you may use tylenol as needed.  Please complete lab work prior to leaving. Schedule mammogram on the first floor.

## 2015-12-04 NOTE — Assessment & Plan Note (Signed)
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 Advised pt on prn tylenol, weight loss.

## 2015-12-04 NOTE — Progress Notes (Addendum)
Subjective:    Patient ID: Victoria Mejia, female    DOB: 11-27-1970, 45 y.o.   MRN: [id number]  HPI  Victoria Mejia is a 45 yr old female who presents today for follow up.  1) anemia- No longer has IUD since the fall.  Has heavy period the first few day Lab Results  Component Value Date   WBC 6.1 08/25/2015   HGB 12.7 08/25/2015   HCT 38.8 08/25/2015   MCV 71.4 (L) 08/25/2015   PLT 452.0 (H) 08/25/2015   2) Hyperlipidemia- not currently maintained on statin.  Lab Results  Component Value Date   CHOL 181 09/27/2014   HDL 51.10 09/27/2014   LDLCALC 113 (H) 09/27/2014   TRIG 83.0 09/27/2014   CHOLHDL 4 09/27/2014   3) HTN- on hctz.  BP Readings from Last 3 Encounters:  12/04/15 136/88  10/09/15 (!) 146/95  08/25/15 (!) 130/96   4) Ankle pain-bilateral ankle stiffness after sitting, only in the AM's.    Review of Systems    see HPI  Past Medical History:  Diagnosis Date  . History of chicken pox   . Hyperlipidemia   . Hypertension      Social History   Social History  . Marital status: Single    Spouse name: N/A  . Number of children: N/A  . Years of education: N/A   Occupational History  . Not on file.   Social History Main Topics  . Smoking status: Never Smoker  . Smokeless tobacco: Not on file  . Alcohol use 0.0 oz/week     Comment: occasional  . Drug use: No  . Sexual activity: Yes    Partners: Male    Birth control/ protection: Inserts   Other Topics Concern  . Not on file   Social History Narrative   Single, has live in boyfriend   Daughter 2005   Works in [employer]   Completed college   No pets   Enjoys television       Past Surgical History:  Procedure Laterality Date  . CESAREAN SECTION  2005  . HYSTEROSCOPY N/A 01/25/2015   Procedure: HYSTEROSCOPY;  Surgeon: Shelly Bombard, MD;  Location: [facility];  Service: Gynecology;  Laterality: N/A;  . IUD REMOVAL N/A 01/25/2015   Procedure: INTRAUTERINE DEVICE (IUD) REMOVAL;  Surgeon:  Shelly Bombard, MD;  Location: [facility];  Service: Gynecology;  Laterality: N/A;    Family History  Problem Relation Age of Onset  . Hypertension Father   . Alcohol abuse Father   . Stroke Maternal Grandmother   . Arthritis Mother   . Hyperlipidemia Mother   . Heart disease Maternal Uncle 50    No Known Allergies  Current Outpatient Prescriptions on File Prior to Visit  Medication Sig Dispense Refill  . etonogestrel-ethinyl estradiol (NUVARING) 0.12-0.015 MG/24HR vaginal ring Insert vaginally and leave in place for 3 consecutive weeks, then remove for 1 week. 1 each 12  . Ferrous Sulfate (IRON) 325 (65 Fe) MG TABS Take 1 tablet by mouth 2 (two) times daily. 30 each 0  . hydrochlorothiazide (HYDRODIURIL) 25 MG tablet Take 1 tablet (25 mg total) by mouth daily. 90 tablet 1  . ibuprofen (ADVIL,MOTRIN) 800 MG tablet TAKE 1 TABLET(800 MG) BY MOUTH EVERY 8 HOURS AS NEEDED 30 tablet 0   No current facility-administered medications on file prior to visit.     BP 136/88 (BP Location: Left Arm)   Pulse (!) 109   Temp 98.8 F (37.1  C) (Oral)   Resp 16   Ht 5' 3.5" (1.613 m)   Wt 189 lb 9.6 oz (86 kg)   LMP 11/06/2015 (Approximate)   SpO2 99% Comment: room air  BMI 33.06 kg/m    Objective:   Physical Exam  Constitutional: She appears well-developed and well-nourished.  Cardiovascular: Normal rate, regular rhythm and normal heart sounds.   No murmur heard. Pulmonary/Chest: Effort normal and breath sounds normal. No respiratory distress. She has no wheezes.  Musculoskeletal:  Trace bilateral ankle swelling.   Psychiatric: She has a normal mood and affect. Her behavior is normal. Judgment and thought content normal.          Assessment & Plan:

## 2015-12-04 NOTE — Assessment & Plan Note (Signed)
Continues iron. Obtain follow up CBC, iron, ferritin.

## 2015-12-05 ENCOUNTER — Encounter: Payer: Self-pay | Admitting: Family

## 2015-12-05 LAB — CBC WITH DIFFERENTIAL/PLATELET
BASOS ABS: 0.1 10*3/uL (ref 0.0–0.1)
Basophils Relative: 0.8 % (ref 0.0–3.0)
EOS ABS: 0 10*3/uL (ref 0.0–0.7)
Eosinophils Relative: 0.6 % (ref 0.0–5.0)
HCT: 33.7 % — ABNORMAL LOW (ref 36.0–46.0)
Hemoglobin: 11.1 g/dL — ABNORMAL LOW (ref 12.0–15.0)
Lymphocytes Relative: 25 % (ref 12.0–46.0)
Lymphs Abs: 1.8 10*3/uL (ref 0.7–4.0)
MCHC: 33.1 g/dL (ref 30.0–36.0)
MCV: 78.5 fl (ref 78.0–100.0)
MONOS PCT: 3.4 % (ref 3.0–12.0)
Monocytes Absolute: 0.2 10*3/uL (ref 0.1–1.0)
NEUTROS ABS: 5.1 10*3/uL (ref 1.4–7.7)
NEUTROS PCT: 70.2 % (ref 43.0–77.0)
PLATELETS: 451 10*3/uL — AB (ref 150.0–400.0)
RBC: 4.29 Mil/uL (ref 3.87–5.11)
RDW: 15.8 % — ABNORMAL HIGH (ref 11.5–15.5)
WBC: 7.3 10*3/uL (ref 4.0–10.5)

## 2015-12-05 LAB — IRON: IRON: 26 ug/dL — AB (ref 42–145)

## 2015-12-05 LAB — FERRITIN: FERRITIN: 15.1 ng/mL (ref 10.0–291.0)

## 2016-01-04 ENCOUNTER — Other Ambulatory Visit: Payer: Self-pay | Admitting: Family

## 2016-01-04 DIAGNOSIS — Z1231 Encounter for screening mammogram for malignant neoplasm of breast: Secondary | ICD-10-CM

## 2016-01-18 ENCOUNTER — Ambulatory Visit: Payer: Federal, State, Local not specified - PPO

## 2016-01-18 ENCOUNTER — Other Ambulatory Visit: Payer: Self-pay | Admitting: Family

## 2016-01-18 ENCOUNTER — Ambulatory Visit
Admission: RE | Admit: 2016-01-18 | Discharge: 2016-01-18 | Disposition: A | Discharge status: Home / Self Care | Payer: Federal, State, Local not specified - PPO | Source: Ambulatory Visit | Attending: Family | Admitting: Family

## 2016-01-18 DIAGNOSIS — Z1231 Encounter for screening mammogram for malignant neoplasm of breast: Secondary | ICD-10-CM

## 2016-02-09 ENCOUNTER — Encounter: Payer: Self-pay | Admitting: Family

## 2016-02-09 ENCOUNTER — Ambulatory Visit (INDEPENDENT_AMBULATORY_CARE_PROVIDER_SITE_OTHER): Payer: Federal, State, Local not specified - PPO | Admitting: Family

## 2016-02-09 VITALS — BP 158/100 | HR 76 | Temp 99.5°F | Resp 16 | Ht 63.5 in | Wt 188.8 lb

## 2016-02-09 DIAGNOSIS — D509 Iron deficiency anemia, unspecified: Secondary | ICD-10-CM | POA: Diagnosis not present

## 2016-02-09 DIAGNOSIS — Z0000 Encounter for general adult medical examination without abnormal findings: Secondary | ICD-10-CM | POA: Diagnosis not present

## 2016-02-09 DIAGNOSIS — Z23 Encounter for immunization: Secondary | ICD-10-CM

## 2016-02-09 LAB — URINALYSIS, ROUTINE W REFLEX MICROSCOPIC
BILIRUBIN URINE: NEGATIVE
Ketones, ur: NEGATIVE
Leukocytes, UA: NEGATIVE
NITRITE: NEGATIVE
PH: 5.5 (ref 5.0–8.0)
Specific Gravity, Urine: 1.015 (ref 1.000–1.030)
Total Protein, Urine: NEGATIVE
Urine Glucose: NEGATIVE
Urobilinogen, UA: 0.2 (ref 0.0–1.0)
WBC, UA: NONE SEEN (ref 0–?)

## 2016-02-09 LAB — CBC WITH DIFFERENTIAL/PLATELET
BASOS ABS: 0 10*3/uL (ref 0.0–0.1)
Basophils Relative: 0.5 % (ref 0.0–3.0)
EOS ABS: 0.1 10*3/uL (ref 0.0–0.7)
EOS PCT: 1.4 % (ref 0.0–5.0)
HCT: 39 % (ref 36.0–46.0)
Hemoglobin: 12.8 g/dL (ref 12.0–15.0)
LYMPHS ABS: 1.9 10*3/uL (ref 0.7–4.0)
Lymphocytes Relative: 31.2 % (ref 12.0–46.0)
MCHC: 32.9 g/dL (ref 30.0–36.0)
MCV: 77.3 fl — ABNORMAL LOW (ref 78.0–100.0)
MONO ABS: 0.3 10*3/uL (ref 0.1–1.0)
Monocytes Relative: 5.7 % (ref 3.0–12.0)
NEUTROS PCT: 61.2 % (ref 43.0–77.0)
Neutro Abs: 3.7 10*3/uL (ref 1.4–7.7)
Platelets: 400 10*3/uL (ref 150.0–400.0)
RBC: 5.04 Mil/uL (ref 3.87–5.11)
RDW: 15.2 % (ref 11.5–15.5)
WBC: 6 10*3/uL (ref 4.0–10.5)

## 2016-02-09 LAB — BASIC METABOLIC PANEL
BUN: 8 mg/dL (ref 6–23)
CALCIUM: 9.7 mg/dL (ref 8.4–10.5)
CO2: 23 mEq/L (ref 19–32)
Chloride: 103 mEq/L (ref 96–112)
Creatinine, Ser: 0.81 mg/dL (ref 0.40–1.20)
GFR: 99.01 mL/min (ref >60.00)
Glucose, Bld: 97 mg/dL (ref 70–99)
POTASSIUM: 3.5 meq/L (ref 3.5–5.1)
SODIUM: 138 meq/L (ref 135–145)

## 2016-02-09 LAB — LIPID PANEL
Cholesterol: 227 mg/dL — ABNORMAL HIGH (ref 0–200)
HDL: 55.6 mg/dL (ref >39.00)
LDL Cholesterol: 151 mg/dL — ABNORMAL HIGH (ref 0–99)
NonHDL: 171.76
TRIGLYCERIDES: 104 mg/dL (ref 0.0–149.0)
Total CHOL/HDL Ratio: 4
VLDL: 20.8 mg/dL (ref 0.0–40.0)

## 2016-02-09 LAB — HEPATIC FUNCTION PANEL
ALBUMIN: 3.9 g/dL (ref 3.5–5.2)
ALK PHOS: 89 U/L (ref 39–117)
ALT: 16 U/L (ref 0–35)
AST: 19 U/L (ref 0–37)
Bilirubin, Direct: 0 mg/dL (ref 0.0–0.3)
TOTAL PROTEIN: 7.5 g/dL (ref 6.0–8.3)
Total Bilirubin: 0.6 mg/dL (ref 0.2–1.2)

## 2016-02-09 LAB — IRON: IRON: 135 ug/dL (ref 42–145)

## 2016-02-09 LAB — TSH: TSH: 1.84 u[IU]/mL (ref 0.35–4.50)

## 2016-02-09 NOTE — Patient Instructions (Addendum)
Please complete lab work prior to leaving. Work on [employer], exercise, weight loss.  Begin amlodipine 5mg  once daily in addition to hctz for your blood pressure.

## 2016-02-09 NOTE — Progress Notes (Signed)
Pre visit review using our clinic review tool, if applicable. No additional management support is needed unless otherwise documented below in the visit note. 

## 2016-02-09 NOTE — Progress Notes (Addendum)
Subjective:    Patient ID: Victoria Mejia, female    DOB: Sep 27, 1970, 45 y.o.   MRN: [id number]  HPI  Patient presents today for complete physical.  Immunizations:7/16, declines flu shot Diet: needs improvement, needs more vegetables.  Has cut back on fried foods, eating more fish, less bread, drinking too may soda Exercise: not exercising Pap Smear:6/17 Mammogram: 9/14  Iron deficiency anemia- Reports that she is taking iron only once a day. Reports that    Review of Systems  Constitutional: Negative for unexpected weight change.  HENT: Positive for rhinorrhea. Negative for hearing loss.   Eyes: Negative for visual disturbance.  Respiratory:       Mild cough due to drainage  Cardiovascular: Negative for leg swelling.  Gastrointestinal: Negative for constipation and diarrhea.  Genitourinary: Negative for dysuria, frequency and menstrual problem.  Musculoskeletal: Negative for myalgias.       Has some bilateral ankle pain  Skin: Negative for rash.  Neurological: Negative for headaches.  Hematological: Negative for adenopathy.  Psychiatric/Behavioral:       Denies depression/anxiety   Past Medical History:  Diagnosis Date  . History of chicken pox   . Hyperlipidemia   . Hypertension      Social History   Social History  . Marital status: Single    Spouse name: N/A  . Number of children: N/A  . Years of education: N/A   Occupational History  . Not on file.   Social History Main Topics  . Smoking status: Never Smoker  . Smokeless tobacco: Never Used  . Alcohol use 0.0 oz/week     Comment: occasional  . Drug use: No  . Sexual activity: Yes    Partners: Male    Birth control/ protection: Inserts   Other Topics Concern  . Not on file   Social History Narrative   Single, has live in boyfriend   Daughter 2005   Works in [employer]   Completed college   No pets   Enjoys television       Past Surgical History:  Procedure Laterality Date  . CESAREAN  SECTION  2005  . HYSTEROSCOPY N/A 01/25/2015   Procedure: HYSTEROSCOPY;  Surgeon: Shelly Bombard, MD;  Location: [facility];  Service: Gynecology;  Laterality: N/A;  . IUD REMOVAL N/A 01/25/2015   Procedure: INTRAUTERINE DEVICE (IUD) REMOVAL;  Surgeon: Shelly Bombard, MD;  Location: [facility];  Service: Gynecology;  Laterality: N/A;    Family History  Problem Relation Age of Onset  . Hypertension Father   . Alcohol abuse Father   . Stroke Maternal Grandmother   . Arthritis Mother   . Hyperlipidemia Mother   . Heart disease Maternal Uncle 50    No Known Allergies  Current Outpatient Prescriptions on File Prior to Visit  Medication Sig Dispense Refill  . etonogestrel-ethinyl estradiol (NUVARING) 0.12-0.015 MG/24HR vaginal ring Insert vaginally and leave in place for 3 consecutive weeks, then remove for 1 week. 1 each 12  . Ferrous Sulfate (IRON) 325 (65 Fe) MG TABS Take 1 tablet by mouth 2 (two) times daily. 30 each 0  . hydrochlorothiazide (HYDRODIURIL) 25 MG tablet Take 1 tablet (25 mg total) by mouth daily. 90 tablet 1  . ibuprofen (ADVIL,MOTRIN) 800 MG tablet TAKE 1 TABLET(800 MG) BY MOUTH EVERY 8 HOURS AS NEEDED 30 tablet 0   No current facility-administered medications on file prior to visit.     BP (!) 140/94 (BP Location: Right Arm, Cuff Size: Normal)  Pulse 76   Temp 99.5 F (37.5 C) (Oral)   Resp 16   Ht 5' 3.5" (1.613 m)   Wt 188 lb 12.8 oz (85.6 kg)   LMP 02/09/2016   SpO2 100% Comment: room air  BMI 32.92 kg/m       Objective:   Physical Exam  Physical Exam  Constitutional: She is oriented to person, place, and time. She appears well-developed and well-nourished. No distress.  HENT:  Head: Normocephalic and atraumatic.  Right Ear: Tympanic membrane and ear canal normal.  Left Ear: Tympanic membrane and ear canal normal.  Mouth/Throat: Oropharynx is clear and moist.  Eyes: Pupils are equal, round, and reactive to light. No scleral icterus.  Neck: Normal  range of motion. No thyromegaly present.  Cardiovascular: Normal rate and regular rhythm.   No murmur heard. Pulmonary/Chest: Effort normal and breath sounds normal. No respiratory distress. He has no wheezes. She has no rales. She exhibits no tenderness.  Abdominal: Soft. Bowel sounds are normal. She exhibits no distension and no mass. There is no tenderness. There is no rebound and no guarding.  Musculoskeletal: She exhibits no edema.  Lymphadenopathy:    She has no cervical adenopathy.  Neurological: She is alert and oriented to person, place, and time. She exhibits normal muscle tone. Coordination normal.  Skin: Skin is warm and dry.  Psychiatric: She has a normal mood and affect. Her behavior is normal. Judgment and thought content normal.  Breasts: Examined lying Right: Without masses, retractions, discharge or axillary adenopathy.  Left: Without masses, retractions, discharge or axillary adenopathy.      Assessment & Plan:        Assessment & Plan:  Preventative care- discussed healthy diet, exercise, weight loss.  Obtain routine lab work. Flu shot today.    Iron deficiency anemia- only remembering to take 1 tablet a day of iron.   EKG tracing is personally reviewed.  EKG notes NSR.  No acute changes.

## 2016-02-17 ENCOUNTER — Other Ambulatory Visit: Payer: Self-pay | Admitting: Obstetrics

## 2016-02-20 NOTE — Telephone Encounter (Signed)
Review for refill. 

## 2016-02-21 ENCOUNTER — Telehealth: Payer: Self-pay | Admitting: *Deleted

## 2016-02-21 NOTE — Telephone Encounter (Signed)
Fax from pharmacy for refill on Ibuprofen 800mg .   Please advise and/or send refill to pharmacy.

## 2016-02-22 ENCOUNTER — Other Ambulatory Visit: Payer: Self-pay | Admitting: Obstetrics

## 2016-02-22 DIAGNOSIS — R52 Pain, unspecified: Secondary | ICD-10-CM

## 2016-02-22 MED ORDER — IBUPROFEN 800 MG PO TABS
ORAL_TABLET | ORAL | 5 refills | Status: DC
Start: 2016-02-22 — End: 2017-05-19

## 2016-02-22 NOTE — Telephone Encounter (Signed)
Patient needs to schedule appointment for annual.  Call her or send a letter, etc.

## 2016-02-27 NOTE — Telephone Encounter (Signed)
Please schedule

## 2016-03-08 ENCOUNTER — Other Ambulatory Visit: Payer: Self-pay | Admitting: Obstetrics

## 2016-03-08 DIAGNOSIS — Z30019 Encounter for initial prescription of contraceptives, unspecified: Secondary | ICD-10-CM

## 2016-03-13 ENCOUNTER — Ambulatory Visit: Payer: Federal, State, Local not specified - PPO | Admitting: Family

## 2016-03-18 ENCOUNTER — Encounter: Payer: Self-pay | Admitting: Family

## 2016-03-18 ENCOUNTER — Ambulatory Visit (INDEPENDENT_AMBULATORY_CARE_PROVIDER_SITE_OTHER): Payer: Federal, State, Local not specified - PPO | Admitting: Family

## 2016-03-18 DIAGNOSIS — Z0000 Encounter for general adult medical examination without abnormal findings: Secondary | ICD-10-CM

## 2016-03-18 DIAGNOSIS — I10 Essential (primary) hypertension: Secondary | ICD-10-CM

## 2016-03-18 MED ORDER — AMLODIPINE BESYLATE 5 MG PO TABS: 5.0000 mg | ORAL_TABLET | Freq: Every day | ORAL | 3 refills | Status: DC

## 2016-03-18 MED ORDER — HYDROCHLOROTHIAZIDE 25 MG PO TABS: 25.0000 mg | ORAL_TABLET | Freq: Every day | ORAL | 1 refills | Status: DC

## 2016-03-18 NOTE — Progress Notes (Signed)
Pre visit review using our clinic review tool, if applicable. No additional management support is needed unless otherwise documented below in the visit note. 

## 2016-03-18 NOTE — Progress Notes (Signed)
Subjective:    Patient ID: Victoria Mejia, female    DOB: [birth year], 45 y.o.   MRN: [id number]  HPI  Victoria Mejia is a 45 yr old female who presents today for follow up of her hypertension. Last visit her bp was noted to be elevated and amlodipine 5mg  was added to her hctz 25mg  once daily. She states that the medication was never sent to her pharmacy and she thought "you had changed your mind."  She ran out of her hctz 2 days ago. She denies CP/SOB or swelling.   BP Readings from Last 3 Encounters:  03/18/16 (!) 140/97  02/09/16 (!) 158/100  12/04/15 136/88   Review of Systems    see HPI  Past Medical History:  Diagnosis Date  . History of chicken pox   . Hyperlipidemia   . Hypertension      Social History   Social History  . Marital status: Single    Spouse name: N/A  . Number of children: N/A  . Years of education: N/A   Occupational History  . Not on file.   Social History Main Topics  . Smoking status: Never Smoker  . Smokeless tobacco: Never Used  . Alcohol use 0.0 oz/week     Comment: occasional  . Drug use: No  . Sexual activity: Yes    Partners: Male    Birth control/ protection: Inserts   Other Topics Concern  . Not on file   Social History Narrative   Single, has live in boyfriend   Daughter 2005   Works in [employer]   Completed college   No pets   Enjoys television       Past Surgical History:  Procedure Laterality Date  . CESAREAN SECTION  2005  . HYSTEROSCOPY N/A 01/25/2015   Procedure: HYSTEROSCOPY;  Surgeon: Shelly Bombard, MD;  Location: [facility];  Service: Gynecology;  Laterality: N/A;  . IUD REMOVAL N/A 01/25/2015   Procedure: INTRAUTERINE DEVICE (IUD) REMOVAL;  Surgeon: Shelly Bombard, MD;  Location: [facility];  Service: Gynecology;  Laterality: N/A;    Family History  Problem Relation Age of Onset  . Hypertension Father   . Alcohol abuse Father   . Stroke Maternal Grandmother   . Arthritis Mother   . Hyperlipidemia Mother   .  Heart disease Maternal Uncle 50    No Known Allergies  Current Outpatient Prescriptions on File Prior to Visit  Medication Sig Dispense Refill  . Ferrous Sulfate (IRON) 325 (65 Fe) MG TABS Take 1 tablet by mouth 2 (two) times daily. 30 each 0  . ibuprofen (ADVIL,MOTRIN) 800 MG tablet TAKE 1 TABLET(800 MG) BY MOUTH EVERY 8 HOURS AS NEEDED 30 tablet 5  . NUVARING 0.12-0.015 MG/24HR vaginal ring INSERT 1 RING VAGINALLY AND LEAVE IN 3 WEEKS, THEN REMOVE FOR 1 WEEK 1 each 0   No current facility-administered medications on file prior to visit.     BP (!) 140/97 (BP Location: Right Arm, Patient Position: Sitting, Cuff Size: Normal)   Pulse 100   Temp 98.4 F (36.9 C) (Oral)   Resp 16   Ht 5\' 4"  (1.626 m)   Wt 180 lb 3.2 oz (81.7 kg)   LMP 03/16/2016 (Exact Date)   SpO2 100% Comment: RA  BMI 30.93 kg/m    Objective:   Physical Exam  Constitutional: She appears well-developed and well-nourished.  Cardiovascular: Normal rate, regular rhythm and normal heart sounds.   No murmur heard. Pulmonary/Chest: Effort normal and  breath sounds normal. No respiratory distress. She has no wheezes.  Psychiatric: She has a normal mood and affect. Her behavior is normal. Judgment and thought content normal.          Assessment & Plan:

## 2016-03-18 NOTE — Assessment & Plan Note (Signed)
Uncontrolled. Restart hctz, add amlodipine, follow up in 1 week for nurse visit BP check.

## 2016-03-18 NOTE — Patient Instructions (Signed)
- 

## 2016-03-26 ENCOUNTER — Ambulatory Visit (INDEPENDENT_AMBULATORY_CARE_PROVIDER_SITE_OTHER): Payer: Federal, State, Local not specified - PPO | Admitting: Family

## 2016-03-26 VITALS — BP 128/87 | HR 99

## 2016-03-26 DIAGNOSIS — I10 Essential (primary) hypertension: Secondary | ICD-10-CM | POA: Diagnosis not present

## 2016-03-26 NOTE — Progress Notes (Signed)
Pre visit review using our clinic tool,if applicable. No additional management support is 28/87 P=99needed unless otherwise documented below in the visit note.   Patient in for BP check. Bp= 128/87 P=100

## 2016-03-26 NOTE — Progress Notes (Signed)
Noted  

## 2016-03-26 NOTE — Patient Instructions (Signed)
Continue medicationsas ordered. Return in 3 month for follow up visit with Victoria Maria, NP.

## 2016-04-04 ENCOUNTER — Encounter: Payer: Self-pay | Admitting: Medical

## 2016-04-04 ENCOUNTER — Ambulatory Visit (INDEPENDENT_AMBULATORY_CARE_PROVIDER_SITE_OTHER): Payer: Federal, State, Local not specified - PPO | Admitting: Medical

## 2016-04-04 VITALS — BP 126/88 | Temp 98.0°F | Ht 64.0 in | Wt 183.6 lb

## 2016-04-04 DIAGNOSIS — J069 Acute upper respiratory infection, unspecified: Secondary | ICD-10-CM

## 2016-04-04 DIAGNOSIS — R05 Cough: Secondary | ICD-10-CM | POA: Diagnosis not present

## 2016-04-04 DIAGNOSIS — R059 Cough, unspecified: Secondary | ICD-10-CM

## 2016-04-04 MED ORDER — BENZONATATE 100 MG PO CAPS: 100.0000 mg | ORAL_CAPSULE | Freq: Three times a day (TID) | ORAL | 0 refills | Status: DC | PRN

## 2016-04-04 MED ORDER — AZITHROMYCIN 250 MG PO TABS: ORAL_TABLET | ORAL | 0 refills | Status: DC

## 2016-04-04 NOTE — Patient Instructions (Addendum)
You appear to have Upper respiratory infection  You can use plain mucinex(guafenesin otc) and rx benzonatate for cough.  Presently I don't think antibiotic needed. But if over the weekend sinus infection symptoms occur or more chest congestion then start azithromycin.  Regarding you faint transient muscle aches yesterday this did not seem flu like. But if you get recurrent/diffuse body aches then let me know. In that event would send in tamiflu.  Follow up in 7 days or as needed

## 2016-04-04 NOTE — Progress Notes (Signed)
Subjective:    Patient ID: SHAQUELL ROEHR, female    DOB: April 23, 1971, 45 y.o.   MRN: [id number]  HPI  Pt in sick since Sunday.   Started with coughing, sneezing, runny nose and stuffy head. Pt took theraflu and then took cold and cough med. Pt feels chest congestion now. When she coughs brings up mucous.   Pt states had faint achy sensation arms yesterday only for one hours. She has been laying around house resting for 3 days.   Pt had no wheezing.  Some frontal sinus pressure/ha today.  LMP- March 18, 2016.  No wheezing. Occasasional bronchitis.     Review of Systems  Constitutional: Negative for chills, fatigue and fever.  HENT: Positive for congestion, sinus pressure and sneezing. Negative for ear pain, sinus pain and sore throat.   Respiratory: Positive for cough. Negative for chest tightness and shortness of breath.   Cardiovascular: Negative for chest pain and palpitations.  Gastrointestinal: Negative for abdominal pain.  Musculoskeletal: Negative for back pain and neck pain.       See hpi brief transient mild bicep area soreness yesterday. None today.  One second transient upper chest discomfort only when sneezes.  Neurological: Negative for dizziness and headaches.  Hematological: Negative for adenopathy. Does not bruise/bleed easily.  Psychiatric/Behavioral: Negative for behavioral problems and confusion.   Past Medical History:  Diagnosis Date  . History of chicken pox   . Hyperlipidemia   . Hypertension      Social History   Social History  . Marital status: Single    Spouse name: N/A  . Number of children: N/A  . Years of education: N/A   Occupational History  . Not on file.   Social History Main Topics  . Smoking status: Never Smoker  . Smokeless tobacco: Never Used  . Alcohol use 0.0 oz/week     Comment: occasional  . Drug use: No  . Sexual activity: Yes    Partners: Male    Birth control/ protection: Inserts   Other Topics Concern   . Not on file   Social History Narrative   Single, has live in boyfriend   Daughter 2005   Works in [employer]   Completed college   No pets   Enjoys television       Past Surgical History:  Procedure Laterality Date  . CESAREAN SECTION  2005  . HYSTEROSCOPY N/A 01/25/2015   Procedure: HYSTEROSCOPY;  Surgeon: Shelly Bombard, MD;  Location: [facility];  Service: Gynecology;  Laterality: N/A;  . IUD REMOVAL N/A 01/25/2015   Procedure: INTRAUTERINE DEVICE (IUD) REMOVAL;  Surgeon: Shelly Bombard, MD;  Location: [facility];  Service: Gynecology;  Laterality: N/A;    Family History  Problem Relation Age of Onset  . Hypertension Father   . Alcohol abuse Father   . Stroke Maternal Grandmother   . Arthritis Mother   . Hyperlipidemia Mother   . Heart disease Maternal Uncle 50    No Known Allergies  Current Outpatient Prescriptions on File Prior to Visit  Medication Sig Dispense Refill  . amLODipine (NORVASC) 5 MG tablet Take 1 tablet (5 mg total) by mouth daily. 30 tablet 3  . Ferrous Sulfate (IRON) 325 (65 Fe) MG TABS Take 1 tablet by mouth 2 (two) times daily. 30 each 0  . hydrochlorothiazide (HYDRODIURIL) 25 MG tablet Take 1 tablet (25 mg total) by mouth daily. 90 tablet 1  . ibuprofen (ADVIL,MOTRIN) 800 MG tablet TAKE 1 TABLET(800 MG)  BY MOUTH EVERY 8 HOURS AS NEEDED 30 tablet 5  . NUVARING 0.12-0.015 MG/24HR vaginal ring INSERT 1 RING VAGINALLY AND LEAVE IN 3 WEEKS, THEN REMOVE FOR 1 WEEK 1 each 0   No current facility-administered medications on file prior to visit.     BP 126/88 (BP Location: Left Arm, Patient Position: Sitting, Cuff Size: Normal)   Temp 98 F (36.7 C) (Oral)   Ht 5\' 4"  (1.626 m)   Wt 183 lb 9.6 oz (83.3 kg)   LMP 03/16/2016 (Exact Date)   BMI 31.51 kg/m       Objective:   Physical Exam  General  Mental Status - Alert. General Appearance - Well groomed. Not in acute distress.  Skin Rashes- No Rashes.  HEENT Head- Normal. Ear Auditory Canal - Left-  Normal. Right - Normal.Tympanic Membrane- Left- Normal. Right- Normal. Eye Sclera/Conjunctiva- Left- Normal. Right- Normal. Nose & Sinuses Nasal Mucosa- Left-  Boggy and Congested. Right-  Boggy and  Congested.Bilateral no  maxillary and no  frontal sinus pressure. Mouth & Throat Lips: Upper Lip- Normal: no dryness, cracking, pallor, cyanosis, or vesicular eruption. Lower Lip-Normal: no dryness, cracking, pallor, cyanosis or vesicular eruption. Buccal Mucosa- Bilateral- No Aphthous ulcers. Oropharynx- No Discharge or Erythema. Tonsils: Characteristics- Bilateral- No Erythema or Congestion. Size/Enlargement- Bilateral- No enlargement. Discharge- bilateral-None.  Neck Neck- Supple. No Masses.   Chest and Lung Exam Auscultation: Breath Sounds:-Clear even and unlabored.  Cardiovascular Auscultation:Rythm- Regular, rate and rhythm. Murmurs & Other Heart Sounds:Ausculatation of the heart reveal- No Murmurs.  Lymphatic Head & Neck General Head & Neck Lymphatics: Bilateral: Description- No Localized lymphadenopathy.       Assessment & Plan:  You appear to have Upper respiratory infection  You can use plain mucinex(guafenesin otc) and rx benzonatate for cough.  Presently I don't think antibiotic needed. But if over the weekend sinus infection symptoms occur or more chest congestion then start azithromcyin.  Regarding you faint transient muscle aches yesterday this did not seem flu like. But if you get recurrent/diffuse body aches then let me know. In that event would send in tamiflu.  Follow up in 7 days or as needed  Pt declined nasal spray for congestion. And after discussion since very appears very averse to putting things in nose decided against flu test since clinically not very suspicious for the flu.  Felisa Zechman, Percell Miller, PA-C

## 2016-04-05 ENCOUNTER — Other Ambulatory Visit: Payer: Self-pay | Admitting: Obstetrics

## 2016-04-05 DIAGNOSIS — Z30019 Encounter for initial prescription of contraceptives, unspecified: Secondary | ICD-10-CM

## 2016-04-17 ENCOUNTER — Other Ambulatory Visit: Payer: Self-pay | Admitting: Medical

## 2016-04-22 ENCOUNTER — Encounter: Payer: Self-pay | Admitting: Medical

## 2016-04-22 MED ORDER — BENZONATATE 100 MG PO CAPS: 100.0000 mg | ORAL_CAPSULE | Freq: Three times a day (TID) | ORAL | 0 refills | Status: DC | PRN

## 2016-04-22 NOTE — Telephone Encounter (Signed)
Victoria Mejia, Please advise.

## 2016-05-06 ENCOUNTER — Other Ambulatory Visit: Payer: Self-pay | Admitting: Obstetrics

## 2016-05-06 DIAGNOSIS — Z30019 Encounter for initial prescription of contraceptives, unspecified: Secondary | ICD-10-CM

## 2016-06-07 ENCOUNTER — Other Ambulatory Visit: Payer: Self-pay | Admitting: Obstetrics

## 2016-06-07 DIAGNOSIS — Z30019 Encounter for initial prescription of contraceptives, unspecified: Secondary | ICD-10-CM

## 2016-06-11 ENCOUNTER — Encounter: Payer: Self-pay | Admitting: Obstetrics

## 2016-06-18 ENCOUNTER — Other Ambulatory Visit: Payer: Self-pay | Admitting: Family

## 2016-06-18 DIAGNOSIS — Z0000 Encounter for general adult medical examination without abnormal findings: Secondary | ICD-10-CM

## 2016-06-26 ENCOUNTER — Telehealth: Payer: Self-pay | Admitting: Family

## 2016-06-26 ENCOUNTER — Ambulatory Visit (INDEPENDENT_AMBULATORY_CARE_PROVIDER_SITE_OTHER): Payer: Federal, State, Local not specified - PPO | Admitting: Family

## 2016-06-26 ENCOUNTER — Encounter: Payer: Self-pay | Admitting: Family

## 2016-06-26 VITALS — BP 124/82 | HR 118 | Resp 16 | Ht 64.0 in | Wt 176.4 lb

## 2016-06-26 DIAGNOSIS — I10 Essential (primary) hypertension: Secondary | ICD-10-CM

## 2016-06-26 DIAGNOSIS — D649 Anemia, unspecified: Secondary | ICD-10-CM

## 2016-06-26 DIAGNOSIS — R739 Hyperglycemia, unspecified: Secondary | ICD-10-CM

## 2016-06-26 DIAGNOSIS — Z0000 Encounter for general adult medical examination without abnormal findings: Secondary | ICD-10-CM

## 2016-06-26 DIAGNOSIS — D509 Iron deficiency anemia, unspecified: Secondary | ICD-10-CM

## 2016-06-26 DIAGNOSIS — E876 Hypokalemia: Secondary | ICD-10-CM

## 2016-06-26 LAB — CBC WITH DIFFERENTIAL/PLATELET
BASOS ABS: 0.1 10*3/uL (ref 0.0–0.1)
Basophils Relative: 0.7 % (ref 0.0–3.0)
Eosinophils Absolute: 0 10*3/uL (ref 0.0–0.7)
Eosinophils Relative: 0.2 % (ref 0.0–5.0)
HCT: 37.4 % (ref 36.0–46.0)
HEMOGLOBIN: 12.5 g/dL (ref 12.0–15.0)
LYMPHS ABS: 1.9 10*3/uL (ref 0.7–4.0)
Lymphocytes Relative: 20.7 % (ref 12.0–46.0)
MCHC: 33.6 g/dL (ref 30.0–36.0)
MCV: 76.2 fl — AB (ref 78.0–100.0)
MONO ABS: 0.5 10*3/uL (ref 0.1–1.0)
MONOS PCT: 5.5 % (ref 3.0–12.0)
Neutro Abs: 6.5 10*3/uL (ref 1.4–7.7)
Neutrophils Relative %: 72.9 % (ref 43.0–77.0)
Platelets: 561 10*3/uL — ABNORMAL HIGH (ref 150.0–400.0)
RBC: 4.9 Mil/uL (ref 3.87–5.11)
RDW: 14.5 % (ref 11.5–15.5)
WBC: 8.9 10*3/uL (ref 4.0–10.5)

## 2016-06-26 LAB — BASIC METABOLIC PANEL
BUN: 9 mg/dL (ref 6–23)
CO2: 27 mEq/L (ref 19–32)
Calcium: 9.6 mg/dL (ref 8.4–10.5)
Chloride: 100 mEq/L (ref 96–112)
Creatinine, Ser: 0.69 mg/dL (ref 0.40–1.20)
GFR: 118.93 mL/min (ref >60.00)
Glucose, Bld: 127 mg/dL — ABNORMAL HIGH (ref 70–99)
Potassium: 2.9 mEq/L — ABNORMAL LOW (ref 3.5–5.1)
Sodium: 137 mEq/L (ref 135–145)

## 2016-06-26 LAB — HEMOGLOBIN A1C: Hgb A1c MFr Bld: 5.8 % (ref 4.6–6.5)

## 2016-06-26 LAB — IRON: IRON: 36 ug/dL — AB (ref 42–145)

## 2016-06-26 MED ORDER — AMLODIPINE BESYLATE 5 MG PO TABS: 5.0000 mg | ORAL_TABLET | Freq: Every day | ORAL | 1 refills | Status: DC

## 2016-06-26 MED ORDER — POTASSIUM CHLORIDE CRYS ER 20 MEQ PO TBCR: 20.0000 meq | EXTENDED_RELEASE_TABLET | Freq: Every day | ORAL | 3 refills | Status: DC

## 2016-06-26 MED ORDER — HYDROCHLOROTHIAZIDE 25 MG PO TABS: 25.0000 mg | ORAL_TABLET | Freq: Every day | ORAL | 1 refills | Status: DC

## 2016-06-26 NOTE — Progress Notes (Signed)
Subjective:    Patient ID: Victoria Mejia, female    DOB: [birth year], 46 y.o.   MRN: [id number]  HPI  Victoria Mejia is a 46 yr old female who presents today for follow up.  HTN- current BP meds include amlodipine 5mg  and hctz 25mg .  BP Readings from Last 3 Encounters:  06/26/16 124/82  04/04/16 126/88  03/26/16 128/87   Anemia- ran out of iron 3 weeks ago.   Lab Results  Component Value Date   WBC 6.0 02/09/2016   HGB 12.8 02/09/2016   HCT 39.0 02/09/2016   MCV 77.3 (L) 02/09/2016   PLT 400.0 02/09/2016     Review of Systems  Respiratory: Negative for shortness of breath.   Cardiovascular: Negative for chest pain and leg swelling.      see HPI  Past Medical History:  Diagnosis Date  . History of chicken pox   . Hyperlipidemia   . Hypertension      Social History   Social History  . Marital status: Single    Spouse name: N/A  . Number of children: N/A  . Years of education: N/A   Occupational History  . Not on file.   Social History Main Topics  . Smoking status: Never Smoker  . Smokeless tobacco: Never Used  . Alcohol use 0.0 oz/week     Comment: occasional  . Drug use: No  . Sexual activity: Yes    Partners: Male    Birth control/ protection: Inserts   Other Topics Concern  . Not on file   Social History Narrative   Single, has live in boyfriend   Daughter 2005   Works in [employer]   Completed college   No pets   Enjoys television       Past Surgical History:  Procedure Laterality Date  . CESAREAN SECTION  2005  . HYSTEROSCOPY N/A 01/25/2015   Procedure: HYSTEROSCOPY;  Surgeon: Shelly Bombard, MD;  Location: [facility];  Service: Gynecology;  Laterality: N/A;  . IUD REMOVAL N/A 01/25/2015   Procedure: INTRAUTERINE DEVICE (IUD) REMOVAL;  Surgeon: Shelly Bombard, MD;  Location: [facility];  Service: Gynecology;  Laterality: N/A;    Family History  Problem Relation Age of Onset  . Hypertension Father   . Alcohol abuse Father   . Stroke  Maternal Grandmother   . Arthritis Mother   . Hyperlipidemia Mother   . Heart disease Maternal Uncle 50    No Known Allergies  Current Outpatient Prescriptions on File Prior to Visit  Medication Sig Dispense Refill  . amLODipine (NORVASC) 5 MG tablet Take 1 tablet (5 mg total) by mouth daily. 30 tablet 3  . benzonatate (TESSALON) 100 MG capsule Take 1 capsule (100 mg total) by mouth 3 (three) times daily as needed for cough. 21 capsule 0  . Ferrous Sulfate (IRON) 325 (65 Fe) MG TABS Take 1 tablet by mouth 2 (two) times daily. 30 each 0  . hydrochlorothiazide (HYDRODIURIL) 25 MG tablet Take 1 tablet (25 mg total) by mouth daily. 90 tablet 1  . ibuprofen (ADVIL,MOTRIN) 800 MG tablet TAKE 1 TABLET(800 MG) BY MOUTH EVERY 8 HOURS AS NEEDED 30 tablet 5  . NUVARING 0.12-0.015 MG/24HR vaginal ring INSERT 1 RING VAGINALLY AND LEAVE IN 3 WEEKS, THEN REMOVE FOR 1 WEEK 1 each 0   No current facility-administered medications on file prior to visit.     BP 124/82 (BP Location: Right Arm, Cuff Size: Normal)   Pulse (!) 118  Resp 16   Ht 5\' 4"  (1.626 m)   Wt 176 lb 6.4 oz (80 kg)   LMP 05/06/2016   SpO2 98% Comment: room air  BMI 30.28 kg/m    Objective:   Physical Exam  Constitutional: She is oriented to person, place, and time. She appears well-developed and well-nourished.  HENT:  Head: Normocephalic and atraumatic.  Cardiovascular: Normal rate, regular rhythm and normal heart sounds.   No murmur heard. Pulmonary/Chest: Effort normal and breath sounds normal. No respiratory distress. She has no wheezes.  Musculoskeletal: She exhibits no edema.  Neurological: She is alert and oriented to person, place, and time.  Psychiatric: She has a normal mood and affect. Her behavior is normal. Judgment and thought content normal.          Assessment & Plan:

## 2016-06-26 NOTE — Patient Instructions (Signed)
Please complete lab work prior to leaving.   

## 2016-06-26 NOTE — Telephone Encounter (Signed)
Potassium is low. I would like her to start kdur, take 2 tabs by mouth today, then one tab once daily. Repeat bmet in 1 week. Dx hypokalemia.  Iron is low, restart iron bid. Repeat serum iron and cbc in 3 months. Dx iron def anemia.

## 2016-06-26 NOTE — Assessment & Plan Note (Signed)
Lab Results  Component Value Date   HGBA1C 6.0 08/25/2015   Will obtain follow up a1c.

## 2016-06-26 NOTE — Assessment & Plan Note (Signed)
Advised pt to restart iron, will check cbc and iron levels.

## 2016-06-26 NOTE — Assessment & Plan Note (Signed)
>>  ASSESSMENT AND PLAN FOR HYPERGLYCEMIA WRITTEN ON 06/26/2016  9:29 AM BY DARYL SETTER, NP  Lab Results  Component Value Date   HGBA1C 6.0 08/25/2015   Will obtain follow up a1c.

## 2016-06-26 NOTE — Assessment & Plan Note (Signed)
bp stable on current medications. Continue same, obtain follow up bmet.

## 2016-06-26 NOTE — Progress Notes (Signed)
Pre visit review using our clinic review tool, if applicable. No additional management support is needed unless otherwise documented below in the visit note. 

## 2016-06-27 ENCOUNTER — Encounter: Payer: Self-pay | Admitting: Obstetrics

## 2016-06-27 ENCOUNTER — Ambulatory Visit (INDEPENDENT_AMBULATORY_CARE_PROVIDER_SITE_OTHER): Payer: Federal, State, Local not specified - PPO | Admitting: Obstetrics

## 2016-06-27 VITALS — BP 127/93 | HR 112 | Ht 64.0 in | Wt 174.0 lb

## 2016-06-27 DIAGNOSIS — Z30011 Encounter for initial prescription of contraceptive pills: Secondary | ICD-10-CM

## 2016-06-27 DIAGNOSIS — Z113 Encounter for screening for infections with a predominantly sexual mode of transmission: Secondary | ICD-10-CM | POA: Diagnosis not present

## 2016-06-27 DIAGNOSIS — N939 Abnormal uterine and vaginal bleeding, unspecified: Secondary | ICD-10-CM | POA: Diagnosis not present

## 2016-06-27 MED ORDER — NORETHIN ACE-ETH ESTRAD-FE 1-20 MG-MCG(24) PO TABS: 1.0000 | ORAL_TABLET | Freq: Every day | ORAL | 11 refills | Status: DC

## 2016-06-27 NOTE — Progress Notes (Signed)
Unsure LMP d/t prolonged bleeding beginning of January and ended towards the end. Started [employer] 2016. Wishes to change BC to the pill. Requests VE to make sure no signs of tampon.

## 2016-06-27 NOTE — Telephone Encounter (Signed)
Pt notified and made aware. She stated understanding and is in agreement with plan.  Future lab appts scheduled.  Future labs ordered.

## 2016-06-27 NOTE — Progress Notes (Signed)
Subjective:    Victoria Mejia is a 46 y.o. female who presents for contraception counseling. The patient using NuvaRing for contraception, and has had prolonged vaginal bleeding since January.  Denies pelvic pain or change in discharge.. The patient is sexually active. Pertinent past medical history: hypertension.  The information documented in the HPI was reviewed and verified.  Menstrual History: OB History    Gravida Para Term Preterm AB Living   2       1 1    SAB TAB Ectopic Multiple Live Births   1       1       No LMP recorded.   Patient Active Problem List   Diagnosis Date Noted  . Arthritis 12/04/2015  . Hyperglycemia 08/28/2015  . Anemia 05/17/2015  . Preventative health care 11/09/2014  . HTN (hypertension) 09/27/2014  . Atypical chest pain 09/27/2014  . Hyperlipidemia 09/27/2014   Past Medical History:  Diagnosis Date  . History of chicken pox   . Hyperlipidemia   . Hypertension     Past Surgical History:  Procedure Laterality Date  . CESAREAN SECTION  2005  . HYSTEROSCOPY N/A 01/25/2015   Procedure: HYSTEROSCOPY;  Surgeon: Shelly Bombard, MD;  Location: [facility];  Service: Gynecology;  Laterality: N/A;  . IUD REMOVAL N/A 01/25/2015   Procedure: INTRAUTERINE DEVICE (IUD) REMOVAL;  Surgeon: Shelly Bombard, MD;  Location: [facility];  Service: Gynecology;  Laterality: N/A;     Current Outpatient Prescriptions:  .  amLODipine (NORVASC) 5 MG tablet, Take 1 tablet (5 mg total) by mouth daily., Disp: 90 tablet, Rfl: 1 .  hydrochlorothiazide (HYDRODIURIL) 25 MG tablet, Take 1 tablet (25 mg total) by mouth daily., Disp: 90 tablet, Rfl: 1 .  ibuprofen (ADVIL,MOTRIN) 800 MG tablet, TAKE 1 TABLET(800 MG) BY MOUTH EVERY 8 HOURS AS NEEDED, Disp: 30 tablet, Rfl: 5 .  NUVARING 0.12-0.015 MG/24HR vaginal ring, INSERT 1 RING VAGINALLY AND LEAVE IN 3 WEEKS, THEN REMOVE FOR 1 WEEK, Disp: 1 each, Rfl: 0 .  Ferrous Sulfate (IRON) 325 (65 Fe) MG TABS, Take 1 tablet by mouth 2  (two) times daily. (Patient not taking: Reported on 06/27/2016), Disp: 30 each, Rfl: 0 .  Norethindrone Acetate-Ethinyl Estrad-FE (LOESTRIN 24 FE) 1-20 MG-MCG(24) tablet, Take 1 tablet by mouth daily., Disp: 1 Package, Rfl: 11 .  potassium chloride SA (K-DUR,KLOR-CON) 20 MEQ tablet, Take 1 tablet (20 mEq total) by mouth daily. (Patient not taking: Reported on 06/27/2016), Disp: 30 tablet, Rfl: 3 No Known Allergies  Social History  Substance Use Topics  . Smoking status: Never Smoker  . Smokeless tobacco: Never Used  . Alcohol use 0.0 oz/week     Comment: occasional    Family History  Problem Relation Age of Onset  . Hypertension Father   . Alcohol abuse Father   . Stroke Maternal Grandmother   . Arthritis Mother   . Hyperlipidemia Mother   . Heart disease Maternal Uncle 50       Review of Systems Constitutional: negative for weight loss Genitourinary:positive for abnormal menstrual periods   Objective:   BP (!) 127/93   Pulse (!) 112   Ht 5\' 4"  (1.626 m)   Wt 174 lb (78.9 kg)   BMI 29.87 kg/m    General:   alert  Skin:   no rash or abnormalities  Lungs:   clear to auscultation bilaterally  Heart:   regular rate and rhythm, S1, S2 normal, no murmur, click, rub or gallop  Breasts:   normal without suspicious masses, skin or nipple changes or axillary nodes  Abdomen:  normal findings: no organomegaly, soft, non-tender and no hernia  Pelvis:  External genitalia: normal general appearance Urinary system: urethral meatus normal and bladder without fullness, nontender Vaginal: normal without tenderness, induration or masses Cervix: normal appearance Adnexa: normal bimanual exam Uterus: anteverted and non-tender, normal size   Lab Review Urine pregnancy test Labs reviewed yes Radiologic studies reviewed no  50% of 15 min visit spent on counseling and coordination of care.    Assessment:    45 y.o., discontinuing NuvaRing vaginal inserts because of AUB, and starting  OCP's no contraindications.   Plan:    All questions answered. Contraception: OCP (estrogen/progesterone). Follow up in 3 months.    Meds ordered this encounter  Medications  . Norethindrone Acetate-Ethinyl Estrad-FE (LOESTRIN 24 FE) 1-20 MG-MCG(24) tablet    Sig: Take 1 tablet by mouth daily.    Dispense:  1 Package    Refill:  11   No orders of the defined types were placed in this encounter.

## 2016-06-28 LAB — CERVICOVAGINAL ANCILLARY ONLY
Bacterial vaginitis: NEGATIVE
Candida vaginitis: NEGATIVE
Chlamydia: NEGATIVE
NEISSERIA GONORRHEA: NEGATIVE
Trichomonas: NEGATIVE

## 2016-07-04 ENCOUNTER — Other Ambulatory Visit (INDEPENDENT_AMBULATORY_CARE_PROVIDER_SITE_OTHER): Payer: Federal, State, Local not specified - PPO

## 2016-07-04 ENCOUNTER — Other Ambulatory Visit: Payer: Self-pay | Admitting: Family

## 2016-07-04 DIAGNOSIS — D509 Iron deficiency anemia, unspecified: Secondary | ICD-10-CM

## 2016-07-04 DIAGNOSIS — E876 Hypokalemia: Secondary | ICD-10-CM

## 2016-07-04 LAB — BASIC METABOLIC PANEL
BUN: 8 mg/dL (ref 6–23)
CALCIUM: 9.5 mg/dL (ref 8.4–10.5)
CO2: 26 mEq/L (ref 19–32)
CREATININE: 0.67 mg/dL (ref 0.40–1.20)
Chloride: 103 mEq/L (ref 96–112)
GFR: 123.02 mL/min (ref >60.00)
GLUCOSE: 131 mg/dL — AB (ref 70–99)
Potassium: 3.2 mEq/L — ABNORMAL LOW (ref 3.5–5.1)
Sodium: 137 mEq/L (ref 135–145)

## 2016-07-04 LAB — CBC
HCT: 37.2 % (ref 36.0–46.0)
Hemoglobin: 12.4 g/dL (ref 12.0–15.0)
MCHC: 33.3 g/dL (ref 30.0–36.0)
MCV: 76.2 fl — AB (ref 78.0–100.0)
PLATELETS: 600 10*3/uL — AB (ref 150.0–400.0)
RBC: 4.89 Mil/uL (ref 3.87–5.11)
RDW: 14.4 % (ref 11.5–15.5)
WBC: 6.6 10*3/uL (ref 4.0–10.5)

## 2016-07-04 LAB — IRON: Iron: 82 ug/dL (ref 42–145)

## 2016-07-04 MED ORDER — POTASSIUM CHLORIDE CRYS ER 20 MEQ PO TBCR: 20.0000 meq | EXTENDED_RELEASE_TABLET | Freq: Two times a day (BID) | ORAL | 3 refills | Status: DC

## 2016-07-08 ENCOUNTER — Other Ambulatory Visit: Payer: Self-pay | Admitting: Family

## 2016-07-08 DIAGNOSIS — R7989 Other specified abnormal findings of blood chemistry: Secondary | ICD-10-CM

## 2016-07-08 DIAGNOSIS — E876 Hypokalemia: Secondary | ICD-10-CM

## 2016-07-15 ENCOUNTER — Other Ambulatory Visit: Payer: Federal, State, Local not specified - PPO

## 2016-07-23 ENCOUNTER — Other Ambulatory Visit (INDEPENDENT_AMBULATORY_CARE_PROVIDER_SITE_OTHER): Payer: Federal, State, Local not specified - PPO

## 2016-07-23 DIAGNOSIS — D473 Essential (hemorrhagic) thrombocythemia: Secondary | ICD-10-CM

## 2016-07-23 DIAGNOSIS — R7989 Other specified abnormal findings of blood chemistry: Secondary | ICD-10-CM

## 2016-07-23 DIAGNOSIS — E876 Hypokalemia: Secondary | ICD-10-CM | POA: Diagnosis not present

## 2016-07-24 LAB — BASIC METABOLIC PANEL
BUN: 10 mg/dL (ref 6–23)
CALCIUM: 9.9 mg/dL (ref 8.4–10.5)
CO2: 27 meq/L (ref 19–32)
CREATININE: 0.69 mg/dL (ref 0.40–1.20)
Chloride: 97 mEq/L (ref 96–112)
GFR: 118.88 mL/min (ref >60.00)
GLUCOSE: 86 mg/dL (ref 70–99)
Potassium: 3.2 mEq/L — ABNORMAL LOW (ref 3.5–5.1)
Sodium: 136 mEq/L (ref 135–145)

## 2016-07-24 LAB — CBC WITH DIFFERENTIAL/PLATELET
Basophils Absolute: 0 10*3/uL (ref 0.0–0.1)
Basophils Relative: 0.3 % (ref 0.0–3.0)
Eosinophils Absolute: 0.1 10*3/uL (ref 0.0–0.7)
Eosinophils Relative: 0.9 % (ref 0.0–5.0)
HCT: 39.6 % (ref 36.0–46.0)
HEMOGLOBIN: 13.2 g/dL (ref 12.0–15.0)
LYMPHS ABS: 3.4 10*3/uL (ref 0.7–4.0)
Lymphocytes Relative: 41.5 % (ref 12.0–46.0)
MCHC: 33.2 g/dL (ref 30.0–36.0)
MCV: 76.2 fl — AB (ref 78.0–100.0)
MONOS PCT: 6.6 % (ref 3.0–12.0)
Monocytes Absolute: 0.5 10*3/uL (ref 0.1–1.0)
Neutro Abs: 4.2 10*3/uL (ref 1.4–7.7)
Neutrophils Relative %: 50.7 % (ref 43.0–77.0)
Platelets: 540 10*3/uL — ABNORMAL HIGH (ref 150.0–400.0)
RBC: 5.2 Mil/uL — AB (ref 3.87–5.11)
RDW: 15.3 % (ref 11.5–15.5)
WBC: 8.3 10*3/uL (ref 4.0–10.5)

## 2016-07-26 ENCOUNTER — Telehealth: Payer: Self-pay | Admitting: Family

## 2016-07-26 DIAGNOSIS — E876 Hypokalemia: Secondary | ICD-10-CM

## 2016-07-26 MED ORDER — SPIRONOLACTONE 25 MG PO TABS: 25.0000 mg | ORAL_TABLET | Freq: Every day | ORAL | 3 refills | Status: DC

## 2016-07-26 NOTE — Telephone Encounter (Signed)
Notified pt and she voices understanding. Due to holiday, nurse visit scheduled for 08/13/16 at 9:30am and will complete lab after that. Lab order entered. Pt taking loestrin and no longer has nuvaring. Med list updated.

## 2016-07-26 NOTE — Telephone Encounter (Signed)
Potassium is still low.   Let's have her take 2 tabs of potassium total today.  Then d/c potassium, d/c hctz.  Start aldactone once daily instead. Follow up in 1 week for nurse visit BP check and follow up bmet. She needs to continue birth control while on aldactone. Is she on the pill or nuvaring? Both are listed.

## 2016-08-13 ENCOUNTER — Other Ambulatory Visit (INDEPENDENT_AMBULATORY_CARE_PROVIDER_SITE_OTHER): Payer: Federal, State, Local not specified - PPO

## 2016-08-13 ENCOUNTER — Ambulatory Visit (INDEPENDENT_AMBULATORY_CARE_PROVIDER_SITE_OTHER): Payer: Federal, State, Local not specified - PPO | Admitting: Family

## 2016-08-13 VITALS — BP 128/89 | HR 92

## 2016-08-13 DIAGNOSIS — I10 Essential (primary) hypertension: Secondary | ICD-10-CM

## 2016-08-13 DIAGNOSIS — E876 Hypokalemia: Secondary | ICD-10-CM | POA: Diagnosis not present

## 2016-08-13 LAB — BASIC METABOLIC PANEL
BUN: 15 mg/dL (ref 6–23)
CO2: 26 meq/L (ref 19–32)
Calcium: 9.9 mg/dL (ref 8.4–10.5)
Chloride: 104 mEq/L (ref 96–112)
Creatinine, Ser: 0.7 mg/dL (ref 0.40–1.20)
GFR: 116.9 mL/min (ref >60.00)
GLUCOSE: 112 mg/dL — AB (ref 70–99)
POTASSIUM: 4.2 meq/L (ref 3.5–5.1)
SODIUM: 137 meq/L (ref 135–145)

## 2016-08-13 NOTE — Patient Instructions (Signed)
Per [name], NP: Continue current medications & regimen. Complete BMP (Basic Metabolic Panel) lab work today. Return in 3 months for an office visit with PCP.

## 2016-08-13 NOTE — Progress Notes (Signed)
Pre visit review using our clinic review tool, if applicable. No additional management support is needed unless otherwise documented below in the visit note.  Patient presents in clinic for blood pressure check per telephone note 07/26/16. Reviewed medications with the patient. She reports adherence to the prescribed regimen. Readings during today's visit were as follow: BP 126/91 P 94 & BP 128/89 P 92.  Per [name], NP: Continue current medications & regimen. Complete BMP (Basic Metabolic Panel) lab work today. Return in 3 months for an office visit with PCP.  Patient was made aware of the provider's recommendations and verbalized understanding. Next appointment scheduled for 11/12/16 at 9:30 AM.

## 2016-09-23 ENCOUNTER — Encounter: Payer: Self-pay | Admitting: *Deleted

## 2016-09-24 ENCOUNTER — Telehealth: Payer: Self-pay | Admitting: *Deleted

## 2016-09-24 ENCOUNTER — Other Ambulatory Visit (INDEPENDENT_AMBULATORY_CARE_PROVIDER_SITE_OTHER): Payer: Federal, State, Local not specified - PPO

## 2016-09-24 DIAGNOSIS — D75839 Thrombocytosis, unspecified: Secondary | ICD-10-CM

## 2016-09-24 DIAGNOSIS — E876 Hypokalemia: Secondary | ICD-10-CM

## 2016-09-24 DIAGNOSIS — Z931 Gastrostomy status: Secondary | ICD-10-CM

## 2016-09-24 DIAGNOSIS — D473 Essential (hemorrhagic) thrombocythemia: Secondary | ICD-10-CM

## 2016-09-24 NOTE — Telephone Encounter (Signed)
-----   Message from Caffie Pinto sent at 09/24/2016  4:01 PM EDT ----- Regarding: Lab orders This patient came in for labs today 09-24-16, all I saw was Iron on the Appt note, no orders were in Epic. Then the patient said she was having something for her BP checked. I was not sure what to order so I drew blood and I'm going to leave it in the refrig. For Angie & Shanon Brow tomorrow if you will let them know what she needs.

## 2016-09-24 NOTE — Telephone Encounter (Signed)
Melissa-- please advise what labs pt should be having?

## 2016-09-24 NOTE — Telephone Encounter (Signed)
Orders have been placed in EPIC.

## 2016-09-25 LAB — BASIC METABOLIC PANEL
BUN: 9 mg/dL (ref 6–23)
CALCIUM: 9.6 mg/dL (ref 8.4–10.5)
CHLORIDE: 105 meq/L (ref 96–112)
CO2: 23 meq/L (ref 19–32)
CREATININE: 0.81 mg/dL (ref 0.40–1.20)
GFR: 98.72 mL/min (ref >60.00)
Glucose, Bld: 101 mg/dL — ABNORMAL HIGH (ref 70–99)
Potassium: 3.9 mEq/L (ref 3.5–5.1)
Sodium: 136 mEq/L (ref 135–145)

## 2016-09-25 LAB — CBC WITH DIFFERENTIAL/PLATELET
BASOS ABS: 0 10*3/uL (ref 0.0–0.1)
Basophils Relative: 0.5 % (ref 0.0–3.0)
Eosinophils Absolute: 0 10*3/uL (ref 0.0–0.7)
Eosinophils Relative: 0.6 % (ref 0.0–5.0)
HCT: 36.2 % (ref 36.0–46.0)
Hemoglobin: 11.8 g/dL — ABNORMAL LOW (ref 12.0–15.0)
LYMPHS ABS: 2.9 10*3/uL (ref 0.7–4.0)
Lymphocytes Relative: 36.8 % (ref 12.0–46.0)
MCHC: 32.5 g/dL (ref 30.0–36.0)
MCV: 74.1 fl — AB (ref 78.0–100.0)
MONO ABS: 0.5 10*3/uL (ref 0.1–1.0)
Monocytes Relative: 6.1 % (ref 3.0–12.0)
NEUTROS ABS: 4.4 10*3/uL (ref 1.4–7.7)
NEUTROS PCT: 56 % (ref 43.0–77.0)
PLATELETS: 544 10*3/uL — AB (ref 150.0–400.0)
RBC: 4.89 Mil/uL (ref 3.87–5.11)
RDW: 15.9 % — ABNORMAL HIGH (ref 11.5–15.5)
WBC: 7.8 10*3/uL (ref 4.0–10.5)

## 2016-10-26 ENCOUNTER — Other Ambulatory Visit: Payer: Self-pay | Admitting: Family

## 2016-10-28 NOTE — Telephone Encounter (Signed)
Refill sent per [facility] refill protocol/SLS  

## 2016-10-31 ENCOUNTER — Ambulatory Visit (INDEPENDENT_AMBULATORY_CARE_PROVIDER_SITE_OTHER): Payer: Federal, State, Local not specified - PPO | Admitting: Obstetrics

## 2016-10-31 ENCOUNTER — Encounter: Payer: Self-pay | Admitting: Obstetrics

## 2016-10-31 ENCOUNTER — Ambulatory Visit: Payer: Self-pay | Admitting: Obstetrics

## 2016-10-31 VITALS — BP 132/90 | HR 99 | Ht 64.0 in | Wt 176.2 lb

## 2016-10-31 DIAGNOSIS — Z3009 Encounter for other general counseling and advice on contraception: Secondary | ICD-10-CM

## 2016-10-31 DIAGNOSIS — Z3041 Encounter for surveillance of contraceptive pills: Secondary | ICD-10-CM

## 2016-10-31 DIAGNOSIS — Z113 Encounter for screening for infections with a predominantly sexual mode of transmission: Secondary | ICD-10-CM | POA: Diagnosis not present

## 2016-10-31 DIAGNOSIS — Z01419 Encounter for gynecological examination (general) (routine) without abnormal findings: Secondary | ICD-10-CM | POA: Diagnosis not present

## 2016-10-31 DIAGNOSIS — Z1151 Encounter for screening for human papillomavirus (HPV): Secondary | ICD-10-CM | POA: Diagnosis not present

## 2016-10-31 DIAGNOSIS — Z124 Encounter for screening for malignant neoplasm of cervix: Secondary | ICD-10-CM

## 2016-10-31 MED ORDER — NORETHINDRONE-ETH ESTRADIOL 1-35 MG-MCG PO TABS: 1.0000 | ORAL_TABLET | Freq: Every day | ORAL | 11 refills | Status: DC

## 2016-10-31 NOTE — Progress Notes (Signed)
Subjective:        Victoria Mejia is a 46 y.o. female here for a routine exam.  Current complaints: Vaginal spotting when pills not taken same time each day..    Personal health questionnaire:  Is patient Ashkenazi Jewish, have a family history of breast and/or ovarian cancer: no Is there a family history of uterine cancer diagnosed at age < 43, gastrointestinal cancer, urinary tract cancer, family member who is a Field seismologist syndrome-associated carrier: no Is the patient overweight and hypertensive, family history of diabetes, personal history of gestational diabetes, preeclampsia or PCOS: no Is patient over 51, have PCOS,  family history of premature CHD under age 40, diabetes, smoke, have hypertension or peripheral artery disease:  no At any time, has a partner hit, kicked or otherwise hurt or frightened you?: no Over the past 2 weeks, have you felt down, depressed or hopeless?: no Over the past 2 weeks, have you felt little interest or pleasure in doing things?:no   Gynecologic History Patient's last menstrual period was 09/30/2016. Contraception: OCP (estrogen/progesterone) Last Pap: 2017. Results were: normal Last mammogram: 2017. Results were: normal  Obstetric History OB History  Gravida Para Term Preterm AB Living  2       1 1   SAB TAB Ectopic Multiple Live Births  1       1    # Outcome Date GA Lbr Len/2nd Weight Sex Delivery Anes PTL Lv  2 Gravida 09/04/03    F CS-LTranv EPI  LIV  1 SAB 2004              Past Medical History:  Diagnosis Date  . History of chicken pox   . Hyperlipidemia   . Hypertension     Past Surgical History:  Procedure Laterality Date  . CESAREAN SECTION  2005  . HYSTEROSCOPY N/A 01/25/2015   Procedure: HYSTEROSCOPY;  Surgeon: Shelly Bombard, MD;  Location: [facility];  Service: Gynecology;  Laterality: N/A;  . IUD REMOVAL N/A 01/25/2015   Procedure: INTRAUTERINE DEVICE (IUD) REMOVAL;  Surgeon: Shelly Bombard, MD;  Location: [facility];   Service: Gynecology;  Laterality: N/A;     Current Outpatient Prescriptions:  .  amLODipine (NORVASC) 5 MG tablet, Take 1 tablet (5 mg total) by mouth daily., Disp: 90 tablet, Rfl: 1 .  Ferrous Sulfate (IRON) 325 (65 Fe) MG TABS, Take 1 tablet by mouth 2 (two) times daily., Disp: 30 each, Rfl: 0 .  ibuprofen (ADVIL,MOTRIN) 800 MG tablet, TAKE 1 TABLET(800 MG) BY MOUTH EVERY 8 HOURS AS NEEDED, Disp: 30 tablet, Rfl: 5 .  spironolactone (ALDACTONE) 25 MG tablet, TAKE 1 TABLET(25 MG) BY MOUTH DAILY, Disp: 90 tablet, Rfl: 0 .  norethindrone-ethinyl estradiol 1/35 ([place] 1/35, 28,) tablet, Take 1 tablet by mouth daily., Disp: 1 Package, Rfl: 11 No Known Allergies  Social History  Substance Use Topics  . Smoking status: Never Smoker  . Smokeless tobacco: Never Used  . Alcohol use 0.0 oz/week     Comment: occasional    Family History  Problem Relation Age of Onset  . Hypertension Father   . Alcohol abuse Father   . Stroke Maternal Grandmother   . Arthritis Mother   . Hyperlipidemia Mother   . Heart disease Maternal Uncle 50      Review of Systems  Constitutional: negative for fatigue and weight loss Respiratory: negative for cough and wheezing Cardiovascular: negative for chest pain, fatigue and palpitations Gastrointestinal: negative for abdominal pain and change  in bowel habits Musculoskeletal:negative for myalgias Neurological: negative for gait problems and tremors Behavioral/Psych: negative for abusive relationship, depression Endocrine: negative for temperature intolerance    Genitourinary:positive for abnormal menstrual periods Integument/breast: negative for breast lump, breast tenderness, nipple discharge and skin lesion(s)    Objective:       BP 132/90   Pulse 99   Ht 5\' 4"  (1.626 m)   Wt 176 lb 3.2 oz (79.9 kg)   LMP 09/30/2016   BMI 30.24 kg/m  General:   alert  Skin:   no rash or abnormalities  Lungs:   clear to auscultation bilaterally  Heart:    regular rate and rhythm, S1, S2 normal, no murmur, click, rub or gallop  Breasts:   normal without suspicious masses, skin or nipple changes or axillary nodes  Abdomen:  normal findings: no organomegaly, soft, non-tender and no hernia  Pelvis:  External genitalia: normal general appearance Urinary system: urethral meatus normal and bladder without fullness, nontender Vaginal: normal without tenderness, induration or masses Cervix: normal appearance Adnexa: normal bimanual exam Uterus: anteverted and non-tender, normal size   Lab Review Urine pregnancy test Labs reviewed yes Radiologic studies reviewed yes  50% of 20 min visit spent on counseling and coordination of care.    Assessment:      1. Encounter for gynecological examination with Papanicolaou smear of cervix Rx: - Cytology - PAP - Cervicovaginal ancillary only  2. General counselling and advice on contraception Rx: - norethindrone-ethinyl estradiol 1/35 ([place] 1/35, 28,) tablet; Take 1 tablet by mouth daily.  Dispense: 1 Package; Refill: 11  3. Encounter for surveillance of contraceptive pills Rx: - discontinue Loestrin 1/20 because of BTB - norethindrone-ethinyl estradiol 1/35 ([place] 1/35, 28,) tablet; Take 1 tablet by mouth daily.  Dispense: 1 Package; Refill: 11  Plan:    Education reviewed: calcium supplements, depression evaluation, low fat, low cholesterol diet, safe sex/STD prevention, self breast exams and weight bearing exercise. Contraception: OCP (estrogen/progesterone). Follow up in: 1 year.   Meds ordered this encounter  Medications  . norethindrone-ethinyl estradiol 1/35 ([place] 1/35, 28,) tablet    Sig: Take 1 tablet by mouth daily.    Dispense:  1 Package    Refill:  11   No orders of the defined types were placed in this encounter.    Patient ID: Victoria Mejia, female   DOB: 09-29-1970, 46 y.o.   MRN: [id number]

## 2016-10-31 NOTE — Progress Notes (Signed)
Patient is in the office for annual exam, last pap 10-09-15, wants std testing. Pt is currently on BC pills, desires change, wants to discuss options.

## 2016-11-01 LAB — CERVICOVAGINAL ANCILLARY ONLY
BACTERIAL VAGINITIS: NEGATIVE
CANDIDA VAGINITIS: POSITIVE — AB
Chlamydia: NEGATIVE
Neisseria Gonorrhea: NEGATIVE
TRICH (WINDOWPATH): NEGATIVE

## 2016-11-01 LAB — CYTOLOGY - PAP
DIAGNOSIS: NEGATIVE
HPV (WINDOPATH): NOT DETECTED

## 2016-11-04 ENCOUNTER — Other Ambulatory Visit: Payer: Self-pay | Admitting: Obstetrics

## 2016-11-04 DIAGNOSIS — B3731 Acute candidiasis of vulva and vagina: Secondary | ICD-10-CM

## 2016-11-04 DIAGNOSIS — B373 Candidiasis of vulva and vagina: Secondary | ICD-10-CM

## 2016-11-04 MED ORDER — FLUCONAZOLE 150 MG PO TABS: 150.0000 mg | ORAL_TABLET | Freq: Once | ORAL | 0 refills | Status: AC

## 2016-11-12 ENCOUNTER — Ambulatory Visit (INDEPENDENT_AMBULATORY_CARE_PROVIDER_SITE_OTHER): Payer: Federal, State, Local not specified - PPO | Admitting: Family

## 2016-11-12 ENCOUNTER — Encounter: Payer: Self-pay | Admitting: Family

## 2016-11-12 VITALS — BP 112/86 | HR 102 | Temp 99.0°F | Resp 16 | Ht 64.0 in | Wt 177.8 lb

## 2016-11-12 DIAGNOSIS — I10 Essential (primary) hypertension: Secondary | ICD-10-CM | POA: Diagnosis not present

## 2016-11-12 DIAGNOSIS — D509 Iron deficiency anemia, unspecified: Secondary | ICD-10-CM | POA: Diagnosis not present

## 2016-11-12 LAB — CBC WITH DIFFERENTIAL/PLATELET
BASOS ABS: 0.1 10*3/uL (ref 0.0–0.1)
BASOS PCT: 0.9 % (ref 0.0–3.0)
EOS ABS: 0 10*3/uL (ref 0.0–0.7)
Eosinophils Relative: 0.8 % (ref 0.0–5.0)
HCT: 34.8 % — ABNORMAL LOW (ref 36.0–46.0)
Hemoglobin: 11.6 g/dL — ABNORMAL LOW (ref 12.0–15.0)
LYMPHS ABS: 1.9 10*3/uL (ref 0.7–4.0)
LYMPHS PCT: 33.2 % (ref 12.0–46.0)
MCHC: 33.3 g/dL (ref 30.0–36.0)
MCV: 72.3 fl — ABNORMAL LOW (ref 78.0–100.0)
MONO ABS: 0.4 10*3/uL (ref 0.1–1.0)
Monocytes Relative: 7.2 % (ref 3.0–12.0)
NEUTROS ABS: 3.3 10*3/uL (ref 1.4–7.7)
NEUTROS PCT: 57.9 % (ref 43.0–77.0)
PLATELETS: 440 10*3/uL — AB (ref 150.0–400.0)
RBC: 4.81 Mil/uL (ref 3.87–5.11)
RDW: 16.4 % — AB (ref 11.5–15.5)
WBC: 5.7 10*3/uL (ref 4.0–10.5)

## 2016-11-12 LAB — BASIC METABOLIC PANEL
BUN: 10 mg/dL (ref 6–23)
CALCIUM: 9.5 mg/dL (ref 8.4–10.5)
CO2: 21 meq/L (ref 19–32)
Chloride: 105 mEq/L (ref 96–112)
Creatinine, Ser: 0.69 mg/dL (ref 0.40–1.20)
GFR: 118.72 mL/min (ref >60.00)
GLUCOSE: 130 mg/dL — AB (ref 70–99)
Potassium: 3.8 mEq/L (ref 3.5–5.1)
Sodium: 136 mEq/L (ref 135–145)

## 2016-11-12 LAB — FERRITIN: FERRITIN: 10.1 ng/mL (ref 10.0–291.0)

## 2016-11-12 LAB — IRON: IRON: 72 ug/dL (ref 42–145)

## 2016-11-12 NOTE — Progress Notes (Signed)
Subjective:    Patient ID: Victoria Mejia, female    DOB: 11/15/1970, 46 y.o.   MRN: [id number]  HPI   Ms. Victoria Mejia is a  46 year old female who presents today for follow-up.   Hypertension-current medications include amlodipine 5 mg once daily and Aldactone 25 mg once daily. Denies SOB/Swelling or CP.   BP Readings from Last 3 Encounters:  11/12/16 112/86  10/31/16 132/90  08/13/16 128/89    Anemia-she has been off of iron x 3 months. She denies fatigue. She reports that her periods are stable and not excessively heavy. Lab Results  Component Value Date   WBC 7.8 09/25/2016   HGB 11.8 (L) 09/25/2016   HCT 36.2 09/25/2016   MCV 74.1 (L) 09/25/2016   PLT 544.0 (H) 09/25/2016    Review of Systems See history of present illness  Past Medical History:  Diagnosis Date  . History of chicken pox   . Hyperlipidemia   . Hypertension      Social History   Social History  . Marital status: Single    Spouse name: N/A  . Number of children: N/A  . Years of education: N/A   Occupational History  . Not on file.   Social History Main Topics  . Smoking status: Never Smoker  . Smokeless tobacco: Never Used  . Alcohol use 0.0 oz/week     Comment: occasional  . Drug use: No  . Sexual activity: Yes    Partners: Male    Birth control/ protection: Pill   Other Topics Concern  . Not on file   Social History Narrative   Single, has live in boyfriend   Daughter 2005   Works in [employer]   Completed college   No pets   Enjoys television       Past Surgical History:  Procedure Laterality Date  . CESAREAN SECTION  2005  . HYSTEROSCOPY N/A 01/25/2015   Procedure: HYSTEROSCOPY;  Surgeon: Shelly Bombard, MD;  Location: [facility];  Service: Gynecology;  Laterality: N/A;  . IUD REMOVAL N/A 01/25/2015   Procedure: INTRAUTERINE DEVICE (IUD) REMOVAL;  Surgeon: Shelly Bombard, MD;  Location: [facility];  Service: Gynecology;  Laterality: N/A;    Family History  Problem Relation  Age of Onset  . Hypertension Father   . Alcohol abuse Father   . Stroke Maternal Grandmother   . Arthritis Mother   . Hyperlipidemia Mother   . Heart disease Maternal Uncle 50    No Known Allergies  Current Outpatient Prescriptions on File Prior to Visit  Medication Sig Dispense Refill  . amLODipine (NORVASC) 5 MG tablet Take 1 tablet (5 mg total) by mouth daily. 90 tablet 1  . ibuprofen (ADVIL,MOTRIN) 800 MG tablet TAKE 1 TABLET(800 MG) BY MOUTH EVERY 8 HOURS AS NEEDED 30 tablet 5  . norethindrone-ethinyl estradiol 1/35 ([place] 1/35, 28,) tablet Take 1 tablet by mouth daily. 1 Package 11  . spironolactone (ALDACTONE) 25 MG tablet TAKE 1 TABLET(25 MG) BY MOUTH DAILY 90 tablet 0   No current facility-administered medications on file prior to visit.     BP 112/86 (BP Location: Left Arm, Cuff Size: Normal)   Pulse (!) 102   Temp 99 F (37.2 C) (Oral)   Resp 16   Ht 5\' 4"  (1.626 m)   Wt 177 lb 12.8 oz (80.6 kg)   LMP 10/31/2016   SpO2 99%   BMI 30.52 kg/m       Objective:   Physical Exam  Constitutional: She is oriented to person, place, and time. She appears well-developed and well-nourished.  HENT:  Head: Normocephalic and atraumatic.  Cardiovascular: Normal rate, regular rhythm and normal heart sounds.   No murmur heard. Pulmonary/Chest: Effort normal and breath sounds normal. No respiratory distress. She has no wheezes.  Musculoskeletal: She exhibits no edema.  Neurological: She is alert and oriented to person, place, and time.  Psychiatric: She has a normal mood and affect. Her behavior is normal. Judgment and thought content normal.          Assessment & Plan:  Hypertension-blood pressure is stable continue current meds. Will obtain follow-up basic metabolic panel  Anemia-she reports that she has not taken iron in 3 months. Will obtain follow-up CBC, serum iron, and ferritin.

## 2016-11-12 NOTE — Patient Instructions (Signed)
Please complete lab work prior to leaving.   

## 2016-11-25 ENCOUNTER — Other Ambulatory Visit: Payer: Self-pay | Admitting: Family

## 2016-12-24 ENCOUNTER — Ambulatory Visit: Payer: Federal, State, Local not specified - PPO | Admitting: Family

## 2016-12-25 ENCOUNTER — Other Ambulatory Visit: Payer: Self-pay | Admitting: Family

## 2017-01-24 ENCOUNTER — Other Ambulatory Visit: Payer: Self-pay | Admitting: Family

## 2017-01-29 ENCOUNTER — Other Ambulatory Visit: Payer: Self-pay | Admitting: Family

## 2017-02-10 ENCOUNTER — Ambulatory Visit
Admission: RE | Admit: 2017-02-10 | Discharge: 2017-02-10 | Disposition: A | Discharge status: Home / Self Care | Payer: Federal, State, Local not specified - PPO | Source: Ambulatory Visit | Attending: Family | Admitting: Family

## 2017-02-10 ENCOUNTER — Ambulatory Visit (INDEPENDENT_AMBULATORY_CARE_PROVIDER_SITE_OTHER): Payer: Federal, State, Local not specified - PPO | Admitting: Family

## 2017-02-10 ENCOUNTER — Encounter: Payer: Self-pay | Admitting: Family

## 2017-02-10 VITALS — BP 117/86 | HR 90 | Temp 99.3°F | Resp 16 | Ht 64.0 in | Wt 174.2 lb

## 2017-02-10 DIAGNOSIS — J302 Other seasonal allergic rhinitis: Secondary | ICD-10-CM | POA: Diagnosis not present

## 2017-02-10 DIAGNOSIS — R739 Hyperglycemia, unspecified: Secondary | ICD-10-CM

## 2017-02-10 DIAGNOSIS — Z1231 Encounter for screening mammogram for malignant neoplasm of breast: Secondary | ICD-10-CM | POA: Diagnosis not present

## 2017-02-10 DIAGNOSIS — Z0000 Encounter for general adult medical examination without abnormal findings: Secondary | ICD-10-CM

## 2017-02-10 DIAGNOSIS — Z23 Encounter for immunization: Secondary | ICD-10-CM | POA: Diagnosis not present

## 2017-02-10 LAB — URINALYSIS, ROUTINE W REFLEX MICROSCOPIC
BILIRUBIN URINE: NEGATIVE
KETONES UR: NEGATIVE
LEUKOCYTES UA: NEGATIVE
NITRITE: NEGATIVE
Specific Gravity, Urine: 1.015 (ref 1.000–1.030)
Total Protein, Urine: NEGATIVE
URINE GLUCOSE: NEGATIVE
UROBILINOGEN UA: 0.2 (ref 0.0–1.0)
pH: 6 (ref 5.0–8.0)

## 2017-02-10 LAB — BASIC METABOLIC PANEL
BUN: 8 mg/dL (ref 6–23)
CALCIUM: 9.9 mg/dL (ref 8.4–10.5)
CHLORIDE: 102 meq/L (ref 96–112)
CO2: 26 meq/L (ref 19–32)
CREATININE: 0.69 mg/dL (ref 0.40–1.20)
GFR: 118.58 mL/min (ref >60.00)
GLUCOSE: 99 mg/dL (ref 70–99)
Potassium: 4.1 mEq/L (ref 3.5–5.1)
Sodium: 137 mEq/L (ref 135–145)

## 2017-02-10 LAB — CBC WITH DIFFERENTIAL/PLATELET
BASOS ABS: 0 10*3/uL (ref 0.0–0.1)
BASOS PCT: 0.8 % (ref 0.0–3.0)
EOS ABS: 0 10*3/uL (ref 0.0–0.7)
Eosinophils Relative: 0.7 % (ref 0.0–5.0)
HEMATOCRIT: 40 % (ref 36.0–46.0)
Hemoglobin: 12.9 g/dL (ref 12.0–15.0)
LYMPHS ABS: 2 10*3/uL (ref 0.7–4.0)
Lymphocytes Relative: 34.3 % (ref 12.0–46.0)
MCHC: 32.4 g/dL (ref 30.0–36.0)
MCV: 75.2 fl — AB (ref 78.0–100.0)
MONOS PCT: 6.7 % (ref 3.0–12.0)
Monocytes Absolute: 0.4 10*3/uL (ref 0.1–1.0)
NEUTROS ABS: 3.4 10*3/uL (ref 1.4–7.7)
NEUTROS PCT: 57.5 % (ref 43.0–77.0)
PLATELETS: 452 10*3/uL — AB (ref 150.0–400.0)
RBC: 5.31 Mil/uL — ABNORMAL HIGH (ref 3.87–5.11)
RDW: 17.2 % — AB (ref 11.5–15.5)
WBC: 6 10*3/uL (ref 4.0–10.5)

## 2017-02-10 LAB — HEPATIC FUNCTION PANEL
ALBUMIN: 4.2 g/dL (ref 3.5–5.2)
ALK PHOS: 86 U/L (ref 39–117)
ALT: 18 U/L (ref 0–35)
AST: 17 U/L (ref 0–37)
Bilirubin, Direct: 0.1 mg/dL (ref 0.0–0.3)
TOTAL PROTEIN: 7.7 g/dL (ref 6.0–8.3)
Total Bilirubin: 0.6 mg/dL (ref 0.2–1.2)

## 2017-02-10 LAB — HEMOGLOBIN A1C: HEMOGLOBIN A1C: 6 % (ref 4.6–6.5)

## 2017-02-10 LAB — LIPID PANEL
Cholesterol: 215 mg/dL — ABNORMAL HIGH (ref 0–200)
HDL: 55.6 mg/dL (ref >39.00)
LDL Cholesterol: 136 mg/dL — ABNORMAL HIGH (ref 0–99)
NONHDL: 159.66
TRIGLYCERIDES: 119 mg/dL (ref 0.0–149.0)
Total CHOL/HDL Ratio: 4
VLDL: 23.8 mg/dL (ref 0.0–40.0)

## 2017-02-10 LAB — TSH: TSH: 2.01 u[IU]/mL (ref 0.35–4.50)

## 2017-02-10 NOTE — Patient Instructions (Addendum)
Add claritin 10mg  once daily for allergy symptoms. Try to add 30 minutes of exercise 5 days a week. Complete lab work prior to leaving.

## 2017-02-10 NOTE — Progress Notes (Addendum)
Subjective:    Patient ID: Victoria Mejia, female    DOB: [birth year], 46 y.o.   MRN: [id number]  HPI  Patient presents today for complete physical.  Immunizations: tetanus up to date 2016, flu shot today Diet: needs improvement.  Needs to eat more fruits/veggies, eats late due to habit.   Exercise: no, has a gym at her complex  Pap Smear: 6/18 Mammogram: will complete today Dental: due Vision:  Will go this fall Wt Readings from Last 3 Encounters:  02/10/17 174 lb 3.2 oz (79 kg)  11/12/16 177 lb 12.8 oz (80.6 kg)  10/31/16 176 lb 3.2 oz (79.9 kg)    Review of Systems  Constitutional: Negative for unexpected weight change.  HENT: Negative for postnasal drip.   Respiratory:       Mild dry cough, improved with coricidin, has "tickle" in her chest sometimes  Cardiovascular: Negative for leg swelling.       Some occasional dependent edema at the end of the day  Gastrointestinal: Negative for blood in stool, constipation and diarrhea.  Genitourinary: Negative for dysuria, frequency, hematuria and menstrual problem.  Musculoskeletal: Negative for arthralgias and myalgias.  Skin: Negative for rash.  Neurological: Negative for headaches.  Hematological: Negative for adenopathy.  Psychiatric/Behavioral:       Denies depression/anxiety   Past Medical History:  Diagnosis Date  . History of chicken pox   . Hyperlipidemia   . Hypertension      Social History   Social History  . Marital status: Single    Spouse name: N/A  . Number of children: N/A  . Years of education: N/A   Occupational History  . Not on file.   Social History Main Topics  . Smoking status: Never Smoker  . Smokeless tobacco: Never Used  . Alcohol use 0.0 oz/week     Comment: occasional  . Drug use: No  . Sexual activity: Yes    Partners: Male    Birth control/ protection: Pill   Other Topics Concern  . Not on file   Social History Narrative   Single, has live in boyfriend    Daughter 2005     Works in [employer]   Completed college   No pets   Enjoys television       Past Surgical History:  Procedure Laterality Date  . CESAREAN SECTION  2005  . HYSTEROSCOPY N/A 01/25/2015   Procedure: HYSTEROSCOPY;  Surgeon: Shelly Bombard, MD;  Location: [facility];  Service: Gynecology;  Laterality: N/A;  . IUD REMOVAL N/A 01/25/2015   Procedure: INTRAUTERINE DEVICE (IUD) REMOVAL;  Surgeon: Shelly Bombard, MD;  Location: [facility];  Service: Gynecology;  Laterality: N/A;    Family History  Problem Relation Age of Onset  . Hypertension Father   . Alcohol abuse Father   . Cirrhosis Father   . Stroke Maternal Grandmother   . Arthritis Mother   . Hyperlipidemia Mother   . Heart disease Maternal Uncle 50    No Known Allergies  Current Outpatient Prescriptions on File Prior to Visit  Medication Sig Dispense Refill  . amLODipine (NORVASC) 5 MG tablet TAKE 1 TABLET(5 MG) BY MOUTH DAILY 90 tablet 0  . ibuprofen (ADVIL,MOTRIN) 800 MG tablet TAKE 1 TABLET(800 MG) BY MOUTH EVERY 8 HOURS AS NEEDED 30 tablet 5  . norethindrone-ethinyl estradiol 1/35 ([place] 1/35, 28,) tablet Take 1 tablet by mouth daily. 1 Package 11  . spironolactone (ALDACTONE) 25 MG tablet TAKE 1 TABLET(25 MG) BY MOUTH  DAILY 90 tablet 1   No current facility-administered medications on file prior to visit.     BP 117/86 (BP Location: Left Arm, Cuff Size: Normal)   Pulse 90   Temp 99.3 F (37.4 C) (Oral)   Resp 16   Ht 5\' 4"  (1.626 m)   Wt 174 lb 3.2 oz (79 kg)   LMP 01/29/2017   SpO2 99%   BMI 29.90 kg/m        Objective:   Physical Exam  Physical Exam  Constitutional: She is oriented to person, place, and time. She appears well-developed and well-nourished. No distress.  HENT:  Head: Normocephalic and atraumatic.  Right Ear: Tympanic membrane and ear canal normal.  Left Ear: Tympanic membrane and ear canal normal.  Mouth/Throat: Oropharynx is clear and moist.  Eyes: Pupils are equal, round, and reactive  to light. No scleral icterus.  Neck: Normal range of motion. No thyromegaly present.  Cardiovascular: Normal rate and regular rhythm.   No murmur heard. Pulmonary/Chest: Effort normal and breath sounds normal. No respiratory distress. He has no wheezes. She has no rales. She exhibits no tenderness.  Abdominal: Soft. Bowel sounds are normal. She exhibits no distension and no mass. There is no tenderness. There is no rebound and no guarding.  Musculoskeletal: She exhibits no edema.  Lymphadenopathy:    She has no cervical adenopathy.  Neurological: She is alert and oriented to person, place, and time. She has normal patellar reflexes. She exhibits normal muscle tone. Coordination normal.  Skin: Skin is warm and dry.  Psychiatric: She has a normal mood and affect. Her behavior is normal. Judgment and thought content normal.  Breasts: Examined lying Right: Without masses, retractions, discharge or axillary adenopathy.  Left: Without masses, retractions, discharge or axillary adenopathy.  Pelvic: deferred       Assessment & Plan:         Assessment & Plan:  Preventative Care- discussed health diet, exercise and weight loss. Obtain routine lab work. Mammogram today. Pap up to date. Flu shot today.  Lab Results  Component Value Date   HGBA1C 5.8 06/26/2016   EKG tracing is personally reviewed.  EKG notes NSR.  No acute changes.   Seasonal allergies- pulmonary symptoms seem consistent with mild allergies. Trial of claritin.

## 2017-04-29 ENCOUNTER — Other Ambulatory Visit: Payer: Self-pay | Admitting: Family

## 2017-05-16 DIAGNOSIS — H35033 Hypertensive retinopathy, bilateral: Secondary | ICD-10-CM | POA: Diagnosis not present

## 2017-05-19 ENCOUNTER — Other Ambulatory Visit: Payer: Self-pay | Admitting: Obstetrics

## 2017-05-19 DIAGNOSIS — R52 Pain, unspecified: Secondary | ICD-10-CM

## 2017-07-24 ENCOUNTER — Other Ambulatory Visit: Payer: Self-pay | Admitting: Family

## 2017-08-11 ENCOUNTER — Ambulatory Visit: Payer: Federal, State, Local not specified - PPO | Admitting: Family

## 2017-08-11 ENCOUNTER — Encounter: Payer: Self-pay | Admitting: Family

## 2017-08-11 VITALS — BP 125/85 | HR 72 | Temp 98.5°F | Resp 18 | Ht 64.0 in | Wt 181.8 lb

## 2017-08-11 DIAGNOSIS — I10 Essential (primary) hypertension: Secondary | ICD-10-CM | POA: Diagnosis not present

## 2017-08-11 DIAGNOSIS — E785 Hyperlipidemia, unspecified: Secondary | ICD-10-CM | POA: Diagnosis not present

## 2017-08-11 LAB — BASIC METABOLIC PANEL
BUN: 8 mg/dL (ref 6–23)
CALCIUM: 9.1 mg/dL (ref 8.4–10.5)
CO2: 24 mEq/L (ref 19–32)
CREATININE: 0.7 mg/dL (ref 0.40–1.20)
Chloride: 104 mEq/L (ref 96–112)
GFR: 116.37 mL/min (ref >60.00)
Glucose, Bld: 132 mg/dL — ABNORMAL HIGH (ref 70–99)
Potassium: 3.8 mEq/L (ref 3.5–5.1)
Sodium: 137 mEq/L (ref 135–145)

## 2017-08-11 LAB — LIPID PANEL
CHOLESTEROL: 189 mg/dL (ref 0–200)
HDL: 49.4 mg/dL (ref >39.00)
LDL Cholesterol: 113 mg/dL — ABNORMAL HIGH (ref 0–99)
NonHDL: 139.57
Total CHOL/HDL Ratio: 4
Triglycerides: 131 mg/dL (ref 0.0–149.0)
VLDL: 26.2 mg/dL (ref 0.0–40.0)

## 2017-08-11 MED ORDER — SPIRONOLACTONE 25 MG PO TABS: ORAL_TABLET | ORAL | 1 refills | Status: DC

## 2017-08-11 MED ORDER — AMLODIPINE BESYLATE 5 MG PO TABS: 5.0000 mg | ORAL_TABLET | Freq: Every day | ORAL | 1 refills | Status: DC

## 2017-08-11 NOTE — Progress Notes (Signed)
Subjective:    Patient ID: Victoria Mejia, female    DOB: Apr 28, 1971, 47 y.o.   MRN: [id number]  HPI  Victoria Mejia is a 47 yr old female who presents today for follow up.   HTN- she is maintained on amlodipine 5mg  once daily as well as aldactone.  She denies swelling. She reports that she has been having some leg cramping at night.    BP Readings from Last 3 Encounters:  08/11/17 (!) 132/92  02/10/17 117/86  11/12/16 112/86   Hyperlipidemia- She is not on a statin.  She reports that she has been working on low fat diet. Sometimes she adheres to it and other times she does not.   Lab Results  Component Value Date   CHOL 215 (H) 02/10/2017   HDL 55.60 02/10/2017   LDLCALC 136 (H) 02/10/2017   TRIG 119.0 02/10/2017   CHOLHDL 4 02/10/2017     Review of Systems    see HPI  Past Medical History:  Diagnosis Date  . History of chicken pox   . Hyperlipidemia   . Hypertension      Social History   Socioeconomic History  . Marital status: Single    Spouse name: Not on file  . Number of children: Not on file  . Years of education: Not on file  . Highest education level: Not on file  Occupational History  . Not on file  Social Needs  . Financial resource strain: Not on file  . Food insecurity:    Worry: Not on file    Inability: Not on file  . Transportation needs:    Medical: Not on file    Non-medical: Not on file  Tobacco Use  . Smoking status: Never Smoker  . Smokeless tobacco: Never Used  Substance and Sexual Activity  . Alcohol use: Yes    Alcohol/week: 0.0 oz    Comment: occasional  . Drug use: No  . Sexual activity: Yes    Partners: Male    Birth control/protection: Pill  Lifestyle  . Physical activity:    Days per week: Not on file    Minutes per session: Not on file  . Stress: Not on file  Relationships  . Social connections:    Talks on phone: Not on file    Gets together: Not on file    Attends religious service: Not on file    Active  member of club or organization: Not on file    Attends meetings of clubs or organizations: Not on file    Relationship status: Not on file  . Intimate partner violence:    Fear of current or ex partner: Not on file    Emotionally abused: Not on file    Physically abused: Not on file    Forced sexual activity: Not on file  Other Topics Concern  . Not on file  Social History Narrative   Single, has live in boyfriend    Daughter 2005    Works in [employer]   Completed college   No pets   Enjoys television    Past Surgical History:  Procedure Laterality Date  . CESAREAN SECTION  2005  . HYSTEROSCOPY N/A 01/25/2015   Procedure: HYSTEROSCOPY;  Surgeon: Shelly Bombard, MD;  Location: [facility];  Service: Gynecology;  Laterality: N/A;  . IUD REMOVAL N/A 01/25/2015   Procedure: INTRAUTERINE DEVICE (IUD) REMOVAL;  Surgeon: Shelly Bombard, MD;  Location: [facility];  Service: Gynecology;  Laterality: N/A;  Family History  Problem Relation Age of Onset  . Hypertension Father   . Alcohol abuse Father   . Cirrhosis Father   . Stroke Maternal Grandmother   . Arthritis Mother   . Hyperlipidemia Mother   . Heart disease Maternal Uncle 50    No Known Allergies  Current Outpatient Medications on File Prior to Visit  Medication Sig Dispense Refill  . amLODipine (NORVASC) 5 MG tablet Take 1 tablet (5 mg total) by mouth daily. 90 tablet 1  . ibuprofen (ADVIL,MOTRIN) 800 MG tablet TAKE 1 TABLET(800 MG) BY MOUTH EVERY 8 HOURS AS NEEDED 30 tablet 5  . norethindrone-ethinyl estradiol 1/35 ([place] 1/35, 28,) tablet Take 1 tablet by mouth daily. 1 Package 11  . spironolactone (ALDACTONE) 25 MG tablet TAKE 1 TABLET(25 MG) BY MOUTH DAILY 90 tablet 1   No current facility-administered medications on file prior to visit.     BP (!) 132/92 (BP Location: Right Arm, Cuff Size: Normal)   Pulse 72   Temp 98.5 F (36.9 C) (Oral)   Resp 18   Ht 5\' 4"  (1.626 m)   Wt 181 lb 12.8 oz (82.5 kg)   LMP  07/21/2017   SpO2 100%   BMI 31.21 kg/m    Objective:   Physical Exam  Constitutional: She is oriented to person, place, and time. She appears well-developed and well-nourished.  Cardiovascular: Normal rate, regular rhythm and normal heart sounds.  No murmur heard. Pulmonary/Chest: Effort normal and breath sounds normal. No respiratory distress. She has no wheezes.  Musculoskeletal: She exhibits no edema.  Neurological: She is alert and oriented to person, place, and time.  Skin: Skin is warm and dry.  Psychiatric: She has a normal mood and affect. Her behavior is normal. Judgment and thought content normal.          Assessment & Plan:  HTN- BP stable. Continue current meds.  Hyperlipidemia- discussed importance of dietary compliance. Obtain follow up lipid panel.

## 2017-08-11 NOTE — Patient Instructions (Signed)
Please complete lab work prior to leaving. Continue to work on low fat/low cholesterol diet.

## 2017-08-12 ENCOUNTER — Encounter: Payer: Self-pay | Admitting: Family

## 2017-08-26 ENCOUNTER — Encounter: Payer: Self-pay | Admitting: Family

## 2017-10-10 ENCOUNTER — Other Ambulatory Visit: Payer: Self-pay | Admitting: Obstetrics

## 2017-10-10 DIAGNOSIS — Z3009 Encounter for other general counseling and advice on contraception: Secondary | ICD-10-CM

## 2017-10-10 DIAGNOSIS — Z3041 Encounter for surveillance of contraceptive pills: Secondary | ICD-10-CM

## 2017-11-07 ENCOUNTER — Other Ambulatory Visit: Payer: Self-pay | Admitting: Obstetrics

## 2017-11-07 DIAGNOSIS — Z3009 Encounter for other general counseling and advice on contraception: Secondary | ICD-10-CM

## 2017-11-07 DIAGNOSIS — Z3041 Encounter for surveillance of contraceptive pills: Secondary | ICD-10-CM

## 2017-11-08 ENCOUNTER — Other Ambulatory Visit: Payer: Self-pay | Admitting: Family

## 2018-02-16 ENCOUNTER — Ambulatory Visit (INDEPENDENT_AMBULATORY_CARE_PROVIDER_SITE_OTHER): Payer: Federal, State, Local not specified - PPO | Admitting: Family

## 2018-02-16 ENCOUNTER — Encounter: Payer: Self-pay | Admitting: Family

## 2018-02-16 VITALS — BP 122/80 | HR 89 | Temp 98.9°F | Resp 16 | Ht 64.0 in | Wt 169.0 lb

## 2018-02-16 DIAGNOSIS — Z23 Encounter for immunization: Secondary | ICD-10-CM

## 2018-02-16 DIAGNOSIS — R0789 Other chest pain: Secondary | ICD-10-CM

## 2018-02-16 DIAGNOSIS — R739 Hyperglycemia, unspecified: Secondary | ICD-10-CM

## 2018-02-16 DIAGNOSIS — I10 Essential (primary) hypertension: Secondary | ICD-10-CM

## 2018-02-16 LAB — BASIC METABOLIC PANEL
BUN: 8 mg/dL (ref 6–23)
CALCIUM: 10 mg/dL (ref 8.4–10.5)
CO2: 29 meq/L (ref 19–32)
CREATININE: 0.67 mg/dL (ref 0.40–1.20)
Chloride: 104 mEq/L (ref 96–112)
GFR: 122.12 mL/min (ref >60.00)
GLUCOSE: 97 mg/dL (ref 70–99)
Potassium: 4.7 mEq/L (ref 3.5–5.1)
SODIUM: 140 meq/L (ref 135–145)

## 2018-02-16 LAB — HEMOGLOBIN A1C: Hgb A1c MFr Bld: 5.7 % (ref 4.6–6.5)

## 2018-02-16 NOTE — Progress Notes (Signed)
Subjective:    Patient ID: Victoria Mejia, female    DOB: 07-May-1970, 47 y.o.   MRN: [id number]  HPI  HTN- maintained on aldactone and amlodipine.   BP Readings from Last 3 Encounters:  02/16/18 122/80  08/11/17 125/85  02/10/17 117/86   Hyperlipidemia- not on medication. Lab Results  Component Value Date   CHOL 189 08/11/2017   HDL 49.40 08/11/2017   LDLCALC 113 (H) 08/11/2017   TRIG 131.0 08/11/2017   CHOLHDL 4 08/11/2017    Reports that she has had "fluttering pain"  on the right chest wall. Happened last week, and had been weeks prior previously.     Review of Systems     Past Medical History:  Diagnosis Date  . History of chicken pox   . Hyperlipidemia   . Hypertension      Social History   Socioeconomic History  . Marital status: Single    Spouse name: Not on file  . Number of children: Not on file  . Years of education: Not on file  . Highest education level: Not on file  Occupational History  . Not on file  Social Needs  . Financial resource strain: Not on file  . Food insecurity:    Worry: Not on file    Inability: Not on file  . Transportation needs:    Medical: Not on file    Non-medical: Not on file  Tobacco Use  . Smoking status: Never Smoker  . Smokeless tobacco: Never Used  Substance and Sexual Activity  . Alcohol use: Yes    Alcohol/week: 0.0 standard drinks    Comment: occasional  . Drug use: No  . Sexual activity: Yes    Partners: Male    Birth control/protection: Pill  Lifestyle  . Physical activity:    Days per week: Not on file    Minutes per session: Not on file  . Stress: Not on file  Relationships  . Social connections:    Talks on phone: Not on file    Gets together: Not on file    Attends religious service: Not on file    Active member of club or organization: Not on file    Attends meetings of clubs or organizations: Not on file    Relationship status: Not on file  . Intimate partner violence:    Fear of  current or ex partner: Not on file    Emotionally abused: Not on file    Physically abused: Not on file    Forced sexual activity: Not on file  Other Topics Concern  . Not on file  Social History Narrative   Single, has live in boyfriend    Daughter 2005    Works in [employer]   Completed college   No pets   Enjoys television    Past Surgical History:  Procedure Laterality Date  . CESAREAN SECTION  2005  . HYSTEROSCOPY N/A 01/25/2015   Procedure: HYSTEROSCOPY;  Surgeon: Shelly Bombard, MD;  Location: [facility];  Service: Gynecology;  Laterality: N/A;  . IUD REMOVAL N/A 01/25/2015   Procedure: INTRAUTERINE DEVICE (IUD) REMOVAL;  Surgeon: Shelly Bombard, MD;  Location: [facility];  Service: Gynecology;  Laterality: N/A;    Family History  Problem Relation Age of Onset  . Hypertension Father   . Alcohol abuse Father   . Cirrhosis Father   . Stroke Maternal Grandmother   . Arthritis Mother   . Hyperlipidemia Mother   . Heart disease  Maternal Uncle 50    No Known Allergies  Current Outpatient Medications on File Prior to Visit  Medication Sig Dispense Refill  . amLODipine (NORVASC) 5 MG tablet Take 1 tablet (5 mg total) by mouth daily. 90 tablet 1  . ibuprofen (ADVIL,MOTRIN) 800 MG tablet TAKE 1 TABLET(800 MG) BY MOUTH EVERY 8 HOURS AS NEEDED 30 tablet 5  . NORTREL 1/35, 28, tablet TAKE 1 TABLET BY MOUTH DAILY 28 tablet 11  . spironolactone (ALDACTONE) 25 MG tablet TAKE 1 TABLET(25 MG) BY MOUTH DAILY 90 tablet 1   No current facility-administered medications on file prior to visit.     BP 122/80 (BP Location: Right Arm, Patient Position: Sitting, Cuff Size: Small)   Pulse 89   Temp 98.9 F (37.2 C) (Oral)   Resp 16   Ht 5\' 4"  (1.626 m)   Wt 169 lb (76.7 kg)   LMP 01/26/2018 (Approximate)   SpO2 98%   BMI 29.01 kg/m    Objective:   Physical Exam  Constitutional: She appears well-developed and well-nourished.  Cardiovascular: Normal rate, regular rhythm and normal heart  sounds.  No murmur heard. Pulmonary/Chest: Effort normal and breath sounds normal. No respiratory distress. She has no wheezes.  Musculoskeletal:  Chest wall is non-tender to palpation  Psychiatric: She has a normal mood and affect. Her behavior is normal. Judgment and thought content normal.          Assessment & Plan:  HTN- BP stable on current meds.  Continue same.  Hyperlipidemia- stable- monitor.  Atypical chest pain- EKG tracing is personally reviewed.  EKG notes NSR.  No acute changes. Obtain bmet.  Suspect musculoskeletal pain. She is advised to call if new/worsening symptoms.   Hyperglycemia- obtain a1C.

## 2018-02-16 NOTE — Patient Instructions (Signed)
Please complete lab work prior to leaving.   

## 2018-03-18 ENCOUNTER — Other Ambulatory Visit: Payer: Self-pay | Admitting: Family

## 2018-03-18 DIAGNOSIS — Z1231 Encounter for screening mammogram for malignant neoplasm of breast: Secondary | ICD-10-CM

## 2018-03-23 ENCOUNTER — Ambulatory Visit
Admission: RE | Admit: 2018-03-23 | Discharge: 2018-03-23 | Disposition: A | Discharge status: Home / Self Care | Payer: Federal, State, Local not specified - PPO | Source: Ambulatory Visit

## 2018-03-23 DIAGNOSIS — Z1231 Encounter for screening mammogram for malignant neoplasm of breast: Secondary | ICD-10-CM

## 2018-05-07 ENCOUNTER — Other Ambulatory Visit: Payer: Self-pay | Admitting: Family

## 2018-06-04 ENCOUNTER — Other Ambulatory Visit: Payer: Self-pay | Admitting: Obstetrics

## 2018-06-04 DIAGNOSIS — R52 Pain, unspecified: Secondary | ICD-10-CM

## 2018-08-12 ENCOUNTER — Other Ambulatory Visit: Payer: Self-pay | Admitting: Family

## 2018-08-25 ENCOUNTER — Encounter: Payer: Self-pay | Admitting: Family

## 2018-08-25 ENCOUNTER — Ambulatory Visit (INDEPENDENT_AMBULATORY_CARE_PROVIDER_SITE_OTHER): Payer: Federal, State, Local not specified - PPO | Admitting: Family

## 2018-08-25 ENCOUNTER — Other Ambulatory Visit: Payer: Self-pay

## 2018-08-25 VITALS — BP 114/90 | Wt 169.0 lb

## 2018-08-25 DIAGNOSIS — I10 Essential (primary) hypertension: Secondary | ICD-10-CM

## 2018-08-25 DIAGNOSIS — R739 Hyperglycemia, unspecified: Secondary | ICD-10-CM | POA: Diagnosis not present

## 2018-08-25 DIAGNOSIS — E785 Hyperlipidemia, unspecified: Secondary | ICD-10-CM

## 2018-08-25 MED ORDER — AMLODIPINE BESYLATE 5 MG PO TABS: ORAL_TABLET | ORAL | 0 refills | Status: DC

## 2018-08-25 NOTE — Progress Notes (Signed)
Virtual Visit via Video Note  I connected with@ on 08/25/18 at  9:00 AM EDT by a video enabled telemedicine application and verified that I am speaking with the correct person using two identifiers. This visit type was conducted due to national recommendations for restrictions regarding the COVID-19 Pandemic (e.g. social distancing).  This format is felt to be most appropriate for this patient at this time.   I discussed the limitations of evaluation and management by telemedicine and the availability of in person appointments. The patient expressed understanding and agreed to proceed.  Only the patient and myself were on today's video visit. The patient was at home and I was in my office at the time of today's visit.   History of Present Illness:  Patient is a 48 yr old female who presents today for follow up.  HTN- current bp meds include amlodipine and aldactone. Denies LE edema, cp or sob.  BP Readings from Last 3 Encounters:  08/25/18 114/90  02/16/18 122/80  08/11/17 125/85      Lab Results  Component Value Date   CHOL 189 08/11/2017   HDL 49.40 08/11/2017   LDLCALC 113 (H) 08/11/2017   TRIG 131.0 08/11/2017   CHOLHDL 4 08/11/2017   Lab Results  Component Value Date   HGBA1C 5.7 02/16/2018   Reports that she has not been exercising as much.  Going for rides in the car.   Wt Readings from Last 3 Encounters:  08/25/18 169 lb (76.7 kg)  02/16/18 169 lb (76.7 kg)  08/11/17 181 lb 12.8 oz (82.5 kg)    Observations/Objective:   Gen: Awake, alert, no acute distress Resp: Breathing is even and non-labored Psych: calm/pleasant demeanor Neuro: Alert and Oriented x 3, + facial symmetry, speech is clear.   Assessment and Plan:  1) HTN- bp stable. Continue current meds. Plan bmet next visit when she is in the office.  2) Hyperlipidemia- last lipid panel looked good. Continue low fat/low cholesterol diet.   3) Hyperglycemia- reinforced importance of healthy diet,  exercise an weight loss.  Plan A1C next visit.    Follow Up Instructions:   f/u in 3 months for face to face visit.  I discussed the assessment and treatment plan with the patient. The patient was provided an opportunity to ask questions and all were answered. The patient agreed with the plan and demonstrated an understanding of the instructions.   The patient was advised to call back or seek an in-person evaluation if the symptoms worsen or if the condition fails to improve as anticipated.    [name], NP

## 2018-10-16 ENCOUNTER — Other Ambulatory Visit: Payer: Self-pay | Admitting: Obstetrics

## 2018-10-16 DIAGNOSIS — Z3009 Encounter for other general counseling and advice on contraception: Secondary | ICD-10-CM

## 2018-10-16 DIAGNOSIS — Z3041 Encounter for surveillance of contraceptive pills: Secondary | ICD-10-CM

## 2018-11-19 ENCOUNTER — Other Ambulatory Visit: Payer: Self-pay

## 2018-11-19 MED ORDER — AMLODIPINE BESYLATE 5 MG PO TABS: ORAL_TABLET | ORAL | 0 refills | Status: DC

## 2018-11-24 ENCOUNTER — Other Ambulatory Visit: Payer: Self-pay

## 2018-11-24 ENCOUNTER — Ambulatory Visit (INDEPENDENT_AMBULATORY_CARE_PROVIDER_SITE_OTHER): Payer: Federal, State, Local not specified - PPO | Admitting: Family

## 2018-11-24 DIAGNOSIS — E785 Hyperlipidemia, unspecified: Secondary | ICD-10-CM

## 2018-11-24 DIAGNOSIS — R739 Hyperglycemia, unspecified: Secondary | ICD-10-CM

## 2018-11-24 DIAGNOSIS — I10 Essential (primary) hypertension: Secondary | ICD-10-CM | POA: Diagnosis not present

## 2018-11-24 DIAGNOSIS — D649 Anemia, unspecified: Secondary | ICD-10-CM | POA: Diagnosis not present

## 2018-11-24 NOTE — Progress Notes (Signed)
Virtual Visit via Video Note  I connected with Victoria Mejia on 11/24/18 at  6:00 PM EDT by a video enabled telemedicine application and verified that I am speaking with the correct person using two identifiers.  Location: Patient: home Provider: work   I discussed the limitations of evaluation and management by telemedicine and the availability of in person appointments. The patient expressed understanding and agreed to proceed.  History of Present Illness:  Patient is a 48 yr old female who presents today for routine follow up.  Hyperglycemia-  Lab Results  Component Value Date   HGBA1C 5.7 02/16/2018   HTN-   bp 130/93 today.  Reports that she is compliant with her medication.  BP Readings from Last 3 Encounters:  08/25/18 114/90  02/16/18 122/80  08/11/17 125/85   Hyperlipidemia-  Lab Results  Component Value Date   CHOL 189 08/11/2017   HDL 49.40 08/11/2017   LDLCALC 113 (H) 08/11/2017   TRIG 131.0 08/11/2017   CHOLHDL 4 08/11/2017      Observations/Objective:   Gen: Awake, alert, no acute distress Resp: Breathing is even and non-labored Psych: calm/pleasant demeanor Neuro: Alert and Oriented x 3, + facial symmetry, speech is clear.   Assessment and Plan:   Hyperglycemia-will obtain follow-up A1c.  Hypertension-her diastolic pressures a little elevated today.  She has not been checking her blood pressure regularly.  I advised the patient to check her blood pressure once daily for the next several days and send me her readings via my chart.  Further recommendations pending review of blood pressure readings.  Continue current meds.  Will obtain follow-up basic metabolic panel.  Hyperlipidemia- will obtain follow-up lipid panel.  She is not on a statin.  History of anemia-will obtain follow-up A1c.  She is currently on an OCP and reports that her recent menstrual cycles have been light.  Follow Up Instructions:    I discussed the assessment and  treatment plan with the patient. The patient was provided an opportunity to ask questions and all were answered. The patient agreed with the plan and demonstrated an understanding of the instructions.   The patient was advised to call back or seek an in-person evaluation if the symptoms worsen or if the condition fails to improve as anticipated.  [name], NP

## 2018-11-25 ENCOUNTER — Encounter: Payer: Self-pay | Admitting: Family

## 2018-11-25 MED ORDER — AMLODIPINE BESYLATE 5 MG PO TABS: 7.5000 mg | ORAL_TABLET | Freq: Every day | ORAL | 0 refills | Status: DC

## 2018-12-02 ENCOUNTER — Other Ambulatory Visit: Payer: Self-pay | Admitting: *Deleted

## 2018-12-02 MED ORDER — AMLODIPINE BESYLATE 5 MG PO TABS: 7.5000 mg | ORAL_TABLET | Freq: Every day | ORAL | 0 refills | Status: DC

## 2018-12-07 ENCOUNTER — Encounter: Payer: Self-pay | Admitting: Family

## 2019-01-19 ENCOUNTER — Telehealth: Payer: Self-pay

## 2019-01-19 NOTE — Telephone Encounter (Signed)
Monday's the nurse schedule is blocked. Pt needs to come on a Monday for labs and wants flu shot at the same time. Is it ok to add her to nurse schedule on a Monday?

## 2019-01-19 NOTE — Telephone Encounter (Signed)
Ok if there is availability on both scheduled at same time.

## 2019-01-20 NOTE — Telephone Encounter (Signed)
Left message on machine for patient to call back to schedule lab first and then we will open up nurse schedule to put her in.

## 2019-01-25 NOTE — Telephone Encounter (Signed)
Lab appt made and flu shot appt made

## 2019-01-29 ENCOUNTER — Telehealth: Payer: Self-pay | Admitting: *Deleted

## 2019-01-29 NOTE — Addendum Note (Signed)
Addended by: Kelle Darting A on: 01/29/2019 05:00 PM   Modules accepted: Orders

## 2019-01-29 NOTE — Telephone Encounter (Signed)
Victoria Mejia -- pt has lab appt scheduled Monday afternoon but I do not see any future lab orders in Epic.  Can you place orders if appropriate?

## 2019-01-29 NOTE — Telephone Encounter (Signed)
I think there are orders on 7/21 that were placed? A1c, lipids, CMP, cbc w diff.

## 2019-01-29 NOTE — Telephone Encounter (Signed)
Ok, they were note entered as future so I will have to go that office visit and change them to future. Thanks!

## 2019-02-01 ENCOUNTER — Ambulatory Visit (INDEPENDENT_AMBULATORY_CARE_PROVIDER_SITE_OTHER): Payer: Federal, State, Local not specified - PPO | Admitting: *Deleted

## 2019-02-01 ENCOUNTER — Other Ambulatory Visit: Payer: Self-pay

## 2019-02-01 ENCOUNTER — Other Ambulatory Visit (INDEPENDENT_AMBULATORY_CARE_PROVIDER_SITE_OTHER): Payer: Federal, State, Local not specified - PPO

## 2019-02-01 DIAGNOSIS — R739 Hyperglycemia, unspecified: Secondary | ICD-10-CM | POA: Diagnosis not present

## 2019-02-01 DIAGNOSIS — E785 Hyperlipidemia, unspecified: Secondary | ICD-10-CM

## 2019-02-01 DIAGNOSIS — D649 Anemia, unspecified: Secondary | ICD-10-CM

## 2019-02-01 DIAGNOSIS — Z23 Encounter for immunization: Secondary | ICD-10-CM | POA: Diagnosis not present

## 2019-02-01 LAB — CBC WITH DIFFERENTIAL/PLATELET
Basophils Absolute: 0.1 10*3/uL (ref 0.0–0.1)
Basophils Relative: 1.1 % (ref 0.0–3.0)
Eosinophils Absolute: 0 10*3/uL (ref 0.0–0.7)
Eosinophils Relative: 0.5 % (ref 0.0–5.0)
HCT: 39.6 % (ref 36.0–46.0)
Hemoglobin: 13 g/dL (ref 12.0–15.0)
Lymphocytes Relative: 35 % (ref 12.0–46.0)
Lymphs Abs: 2.6 10*3/uL (ref 0.7–4.0)
MCHC: 33 g/dL (ref 30.0–36.0)
MCV: 78.1 fl (ref 78.0–100.0)
Monocytes Absolute: 0.5 10*3/uL (ref 0.1–1.0)
Monocytes Relative: 6.3 % (ref 3.0–12.0)
Neutro Abs: 4.2 10*3/uL (ref 1.4–7.7)
Neutrophils Relative %: 57.1 % (ref 43.0–77.0)
Platelets: 467 10*3/uL — ABNORMAL HIGH (ref 150.0–400.0)
RBC: 5.06 Mil/uL (ref 3.87–5.11)
RDW: 15 % (ref 11.5–15.5)
WBC: 7.4 10*3/uL (ref 4.0–10.5)

## 2019-02-01 LAB — BASIC METABOLIC PANEL
BUN: 8 mg/dL (ref 6–23)
CO2: 23 mEq/L (ref 19–32)
Calcium: 9.9 mg/dL (ref 8.4–10.5)
Chloride: 104 mEq/L (ref 96–112)
Creatinine, Ser: 0.61 mg/dL (ref 0.40–1.20)
GFR: 127.49 mL/min (ref >60.00)
Glucose, Bld: 101 mg/dL — ABNORMAL HIGH (ref 70–99)
Potassium: 4.3 mEq/L (ref 3.5–5.1)
Sodium: 139 mEq/L (ref 135–145)

## 2019-02-01 LAB — LIPID PANEL
Cholesterol: 228 mg/dL — ABNORMAL HIGH (ref 0–200)
HDL: 66.4 mg/dL (ref >39.00)
LDL Cholesterol: 137 mg/dL — ABNORMAL HIGH (ref 0–99)
NonHDL: 161.98
Total CHOL/HDL Ratio: 3
Triglycerides: 125 mg/dL (ref 0.0–149.0)
VLDL: 25 mg/dL (ref 0.0–40.0)

## 2019-02-01 LAB — HEMOGLOBIN A1C: Hgb A1c MFr Bld: 6.1 % (ref 4.6–6.5)

## 2019-02-01 NOTE — Progress Notes (Signed)
Patient here for flu vaccine. ° °Vaccine given in right deltoid and patient tolerated well. °

## 2019-02-03 ENCOUNTER — Encounter: Payer: Self-pay | Admitting: Family

## 2019-02-04 ENCOUNTER — Telehealth: Payer: Self-pay | Admitting: *Deleted

## 2019-02-04 MED ORDER — AMLODIPINE BESYLATE 5 MG PO TABS: 7.5000 mg | ORAL_TABLET | Freq: Every day | ORAL | 1 refills | Status: DC

## 2019-02-04 NOTE — Telephone Encounter (Signed)
Received request from [facility] for amlodipine 5mg  daily. Dose was changed on 11/25/18 to 1 and 1/2 tablets daily. Rx sent with new directions.

## 2019-02-09 ENCOUNTER — Other Ambulatory Visit: Payer: Self-pay | Admitting: *Deleted

## 2019-02-09 MED ORDER — SPIRONOLACTONE 25 MG PO TABS: 25.0000 mg | ORAL_TABLET | Freq: Every day | ORAL | 1 refills | Status: DC

## 2019-02-15 DIAGNOSIS — H35033 Hypertensive retinopathy, bilateral: Secondary | ICD-10-CM | POA: Diagnosis not present

## 2019-04-25 DIAGNOSIS — Z20828 Contact with and (suspected) exposure to other viral communicable diseases: Secondary | ICD-10-CM | POA: Diagnosis not present

## 2019-05-06 ENCOUNTER — Other Ambulatory Visit: Payer: Self-pay | Admitting: *Deleted

## 2019-05-06 MED ORDER — AMLODIPINE BESYLATE 5 MG PO TABS: 7.5000 mg | ORAL_TABLET | Freq: Every day | ORAL | 1 refills | Status: DC

## 2019-06-07 ENCOUNTER — Encounter: Payer: Self-pay | Admitting: Family

## 2019-07-12 ENCOUNTER — Other Ambulatory Visit: Payer: Self-pay | Admitting: Family

## 2019-07-12 DIAGNOSIS — Z1231 Encounter for screening mammogram for malignant neoplasm of breast: Secondary | ICD-10-CM

## 2019-07-14 ENCOUNTER — Encounter: Payer: Self-pay | Admitting: Family

## 2019-07-14 ENCOUNTER — Ambulatory Visit: Payer: Federal, State, Local not specified - PPO | Admitting: Family

## 2019-07-14 ENCOUNTER — Ambulatory Visit
Admission: RE | Admit: 2019-07-14 | Discharge: 2019-07-14 | Disposition: A | Discharge status: Home / Self Care | Payer: Federal, State, Local not specified - PPO | Source: Ambulatory Visit

## 2019-07-14 ENCOUNTER — Other Ambulatory Visit: Payer: Self-pay

## 2019-07-14 VITALS — BP 135/95 | HR 92 | Temp 97.0°F | Resp 16 | Ht 64.0 in | Wt 184.0 lb

## 2019-07-14 DIAGNOSIS — R739 Hyperglycemia, unspecified: Secondary | ICD-10-CM | POA: Diagnosis not present

## 2019-07-14 DIAGNOSIS — D649 Anemia, unspecified: Secondary | ICD-10-CM

## 2019-07-14 DIAGNOSIS — L659 Nonscarring hair loss, unspecified: Secondary | ICD-10-CM

## 2019-07-14 DIAGNOSIS — Z1231 Encounter for screening mammogram for malignant neoplasm of breast: Secondary | ICD-10-CM

## 2019-07-14 DIAGNOSIS — I10 Essential (primary) hypertension: Secondary | ICD-10-CM

## 2019-07-14 LAB — CBC WITH DIFFERENTIAL/PLATELET
Basophils Absolute: 0.1 10*3/uL (ref 0.0–0.1)
Basophils Relative: 1.1 % (ref 0.0–3.0)
Eosinophils Absolute: 0.1 10*3/uL (ref 0.0–0.7)
Eosinophils Relative: 0.8 % (ref 0.0–5.0)
HCT: 39.1 % (ref 36.0–46.0)
Hemoglobin: 13.2 g/dL (ref 12.0–15.0)
Lymphocytes Relative: 29.5 % (ref 12.0–46.0)
Lymphs Abs: 2.1 10*3/uL (ref 0.7–4.0)
MCHC: 33.8 g/dL (ref 30.0–36.0)
MCV: 78.4 fl (ref 78.0–100.0)
Monocytes Absolute: 0.5 10*3/uL (ref 0.1–1.0)
Monocytes Relative: 7.3 % (ref 3.0–12.0)
Neutro Abs: 4.3 10*3/uL (ref 1.4–7.7)
Neutrophils Relative %: 61.3 % (ref 43.0–77.0)
Platelets: 435 10*3/uL — ABNORMAL HIGH (ref 150.0–400.0)
RBC: 4.98 Mil/uL (ref 3.87–5.11)
RDW: 14.7 % (ref 11.5–15.5)
WBC: 7 10*3/uL (ref 4.0–10.5)

## 2019-07-14 LAB — BASIC METABOLIC PANEL
BUN: 8 mg/dL (ref 6–23)
CO2: 27 mEq/L (ref 19–32)
Calcium: 9.9 mg/dL (ref 8.4–10.5)
Chloride: 103 mEq/L (ref 96–112)
Creatinine, Ser: 0.68 mg/dL (ref 0.40–1.20)
GFR: 112.25 mL/min (ref >60.00)
Glucose, Bld: 108 mg/dL — ABNORMAL HIGH (ref 70–99)
Potassium: 4.2 mEq/L (ref 3.5–5.1)
Sodium: 138 mEq/L (ref 135–145)

## 2019-07-14 LAB — HEMOGLOBIN A1C: Hgb A1c MFr Bld: 5.8 % (ref 4.6–6.5)

## 2019-07-14 LAB — TSH: TSH: 3.76 u[IU]/mL (ref 0.35–4.50)

## 2019-07-14 MED ORDER — AMLODIPINE BESYLATE 10 MG PO TABS
10.0000 mg | ORAL_TABLET | Freq: Every day | ORAL | 3 refills | Status: DC
Start: 2019-07-14 — End: 2019-11-11

## 2019-07-14 NOTE — Progress Notes (Signed)
Subjective:    Patient ID: Victoria Mejia, female    DOB: 05-Oct-1970, 49 y.o.   MRN: [id number]  HPI  Patient is a 49 yr old female who presents today for follow up.  HTN- She is taking 1.5 tabs of amlodipine daily. Also on aldactone. Denies LE edema.   Wt Readings from Last 3 Encounters:  07/14/19 184 lb (83.5 kg)  08/25/18 169 lb (76.7 kg)  02/16/18 169 lb (76.7 kg)    BP Readings from Last 3 Encounters:  07/14/19 (!) 135/95  08/25/18 114/90  02/16/18 122/80   Hair loss- notes that she first noted hair loss 1 year ago.  Seems to be improving.   Hyperglycemia-  Lab Results  Component Value Date   HGBA1C 6.1 02/01/2019    Anemia-  Lab Results  Component Value Date   WBC 7.4 02/01/2019   HGB 13.0 02/01/2019   HCT 39.6 02/01/2019   MCV 78.1 02/01/2019   PLT 467.0 (H) 02/01/2019    Review of Systems See HPI  Past Medical History:  Diagnosis Date  . History of chicken pox   . Hyperlipidemia   . Hypertension      Social History   Socioeconomic History  . Marital status: Single    Spouse name: Not on file  . Number of children: Not on file  . Years of education: Not on file  . Highest education level: Not on file  Occupational History  . Not on file  Tobacco Use  . Smoking status: Never Smoker  . Smokeless tobacco: Never Used  Substance and Sexual Activity  . Alcohol use: Yes    Alcohol/week: 0.0 standard drinks    Comment: occasional  . Drug use: No  . Sexual activity: Yes    Partners: Male    Birth control/protection: Pill  Other Topics Concern  . Not on file  Social History Narrative   Single, has live in boyfriend    Daughter 2005    Works in [employer]   Completed college   No pets   Enjoys television   Social Determinants of Radio broadcast assistant Strain:   . Difficulty of Paying Living Expenses: Not on file  Food Insecurity:   . Worried About Charity fundraiser in the Last Year: Not on file  . Ran Out of Food in the Last Year:  Not on file  Transportation Needs:   . Lack of Transportation (Medical): Not on file  . Lack of Transportation (Non-Medical): Not on file  Physical Activity:   . Days of Exercise per Week: Not on file  . Minutes of Exercise per Session: Not on file  Stress:   . Feeling of Stress : Not on file  Social Connections:   . Frequency of Communication with Friends and Family: Not on file  . Frequency of Social Gatherings with Friends and Family: Not on file  . Attends Religious Services: Not on file  . Active Member of Clubs or Organizations: Not on file  . Attends Archivist Meetings: Not on file  . Marital Status: Not on file  Intimate Partner Violence:   . Fear of Current or Ex-Partner: Not on file  . Emotionally Abused: Not on file  . Physically Abused: Not on file  . Sexually Abused: Not on file    Past Surgical History:  Procedure Laterality Date  . CESAREAN SECTION  2005  . HYSTEROSCOPY N/A 01/25/2015   Procedure: HYSTEROSCOPY;  Surgeon: Shelly Bombard,  MD;  Location: [facility];  Service: Gynecology;  Laterality: N/A;  . IUD REMOVAL N/A 01/25/2015   Procedure: INTRAUTERINE DEVICE (IUD) REMOVAL;  Surgeon: Shelly Bombard, MD;  Location: [facility];  Service: Gynecology;  Laterality: N/A;    Family History  Problem Relation Age of Onset  . Hypertension Father   . Alcohol abuse Father   . Cirrhosis Father   . Stroke Maternal Grandmother   . Arthritis Mother   . Hyperlipidemia Mother   . Heart disease Maternal Uncle 50    No Known Allergies  Current Outpatient Medications on File Prior to Visit  Medication Sig Dispense Refill  . NORTREL 1/35, 28, tablet TAKE 1 TABLET BY MOUTH DAILY 28 tablet 11  . spironolactone (ALDACTONE) 25 MG tablet Take 1 tablet (25 mg total) by mouth daily. 90 tablet 1   No current facility-administered medications on file prior to visit.    BP (!) 135/95 (BP Location: Right Arm, Patient Position: Sitting, Cuff Size: Small)   Pulse 92    Temp (!) 97 F (36.1 C) (Temporal)   Resp 16   Ht 5\' 4"  (1.626 m)   Wt 184 lb (83.5 kg)   SpO2 99%   BMI 31.58 kg/m       Objective:   Physical Exam Constitutional:      Appearance: She is well-developed.  Neck:     Thyroid: No thyromegaly.  Cardiovascular:     Rate and Rhythm: Normal rate and regular rhythm.     Heart sounds: Normal heart sounds. No murmur.  Pulmonary:     Effort: Pulmonary effort is normal. No respiratory distress.     Breath sounds: Normal breath sounds. No wheezing.  Musculoskeletal:     Cervical back: Neck supple.  Skin:    General: Skin is warm and dry.     Comments: Some mild thinning noted on top of head  Neurological:     Mental Status: She is alert and oriented to person, place, and time.  Psychiatric:        Behavior: Behavior normal.        Thought Content: Thought content normal.        Judgment: Judgment normal.           Assessment & Plan:  Thinning hair- mild hair thinning. I don't see sign of true alopecia. We discussed referral to dermatology but patient declines at this time. Will check TSH, CBC.  HTN- DBP uncontrolled. Will increase amlodipine from 7.5 to 10mg  once daily.  Hyperglycemia- check A1C, discussed diet/exercise/weight loss.  Anemia- check cbc.   This visit occurred during the SARS-CoV-2 public health emergency.  Safety protocols were in place, including screening questions prior to the visit, additional usage of staff PPE, and extensive cleaning of exam room while observing appropriate contact time as indicated for disinfecting solutions.

## 2019-07-14 NOTE — Patient Instructions (Signed)
Please complete lab work prior to leaving. Increase amlodipine to 10mg  once daily.

## 2019-07-21 ENCOUNTER — Ambulatory Visit: Payer: Federal, State, Local not specified - PPO | Admitting: Family

## 2019-08-02 DIAGNOSIS — M7989 Other specified soft tissue disorders: Secondary | ICD-10-CM | POA: Diagnosis not present

## 2019-08-02 DIAGNOSIS — M25521 Pain in right elbow: Secondary | ICD-10-CM | POA: Diagnosis not present

## 2019-08-18 ENCOUNTER — Ambulatory Visit: Payer: Federal, State, Local not specified - PPO | Admitting: Family

## 2019-08-25 ENCOUNTER — Encounter: Payer: Self-pay | Admitting: Family

## 2019-08-25 ENCOUNTER — Other Ambulatory Visit: Payer: Self-pay

## 2019-08-25 ENCOUNTER — Ambulatory Visit: Payer: Federal, State, Local not specified - PPO | Admitting: Family

## 2019-08-25 VITALS — BP 123/79 | HR 107 | Temp 97.8°F | Resp 16 | Ht 64.0 in | Wt 185.0 lb

## 2019-08-25 DIAGNOSIS — I10 Essential (primary) hypertension: Secondary | ICD-10-CM

## 2019-08-25 DIAGNOSIS — R739 Hyperglycemia, unspecified: Secondary | ICD-10-CM

## 2019-08-25 DIAGNOSIS — D649 Anemia, unspecified: Secondary | ICD-10-CM

## 2019-08-25 NOTE — Patient Instructions (Signed)
Below are two ways to schedule a Covid-19 Vaccine:  Please visit [email or web address] to register or call [phone]  Or call:  [place] Covid-19 vaccine scheduling at [phone]  

## 2019-08-25 NOTE — Progress Notes (Signed)
Subjective:    Patient ID: Victoria Mejia, female    DOB: 04/08/1971, 49 y.o.   MRN: [id number]  HPI   Patient is a 49 yr old female who presents today for follow up.  HTN- Last visit we increased her amlodipine from 7.5mg  to 10mg  once daily.  Had swelling in her feet one day then resolved.   BP Readings from Last 3 Encounters:  08/25/19 123/79  07/14/19 (!) 135/95  08/25/18 114/90   Hyperglycemia- follow up A1C was improved last visit.  Lab Results  Component Value Date   HGBA1C 5.8 07/14/2019   HGBA1C 6.1 02/01/2019   HGBA1C 5.7 02/16/2018   Lab Results  Component Value Date   LDLCALC 137 (H) 02/01/2019   CREATININE 0.68 07/14/2019   Anemia-  Lab Results  Component Value Date   WBC 7.0 07/14/2019   HGB 13.2 07/14/2019   HCT 39.1 07/14/2019   MCV 78.4 07/14/2019   PLT 435.0 (H) 07/14/2019       Review of Systems See HPI  Past Medical History:  Diagnosis Date  . History of chicken pox   . Hyperlipidemia   . Hypertension      Social History   Socioeconomic History  . Marital status: Single    Spouse name: Not on file  . Number of children: Not on file  . Years of education: Not on file  . Highest education level: Not on file  Occupational History  . Not on file  Tobacco Use  . Smoking status: Never Smoker  . Smokeless tobacco: Never Used  Substance and Sexual Activity  . Alcohol use: Yes    Alcohol/week: 0.0 standard drinks    Comment: occasional  . Drug use: No  . Sexual activity: Yes    Partners: Male    Birth control/protection: Pill  Other Topics Concern  . Not on file  Social History Narrative   Single, has live in boyfriend    Daughter 2005    Works in [employer]   Completed college   No pets   Enjoys television   Social Determinants of Radio broadcast assistant Strain:   . Difficulty of Paying Living Expenses:   Food Insecurity:   . Worried About Charity fundraiser in the Last Year:   . Arboriculturist in the Last Year:    Transportation Needs:   . Film/video editor (Medical):   Marland Kitchen Lack of Transportation (Non-Medical):   Physical Activity:   . Days of Exercise per Week:   . Minutes of Exercise per Session:   Stress:   . Feeling of Stress :   Social Connections:   . Frequency of Communication with Friends and Family:   . Frequency of Social Gatherings with Friends and Family:   . Attends Religious Services:   . Active Member of Clubs or Organizations:   . Attends Archivist Meetings:   Marland Kitchen Marital Status:   Intimate Partner Violence:   . Fear of Current or Ex-Partner:   . Emotionally Abused:   Marland Kitchen Physically Abused:   . Sexually Abused:     Past Surgical History:  Procedure Laterality Date  . CESAREAN SECTION  2005  . HYSTEROSCOPY N/A 01/25/2015   Procedure: HYSTEROSCOPY;  Surgeon: Shelly Bombard, MD;  Location: [facility];  Service: Gynecology;  Laterality: N/A;  . IUD REMOVAL N/A 01/25/2015   Procedure: INTRAUTERINE DEVICE (IUD) REMOVAL;  Surgeon: Shelly Bombard, MD;  Location: [facility];  Service: Gynecology;  Laterality: N/A;    Family History  Problem Relation Age of Onset  . Hypertension Father   . Alcohol abuse Father   . Cirrhosis Father   . Stroke Maternal Grandmother   . Arthritis Mother   . Hyperlipidemia Mother   . Heart disease Maternal Uncle 50    No Known Allergies  Current Outpatient Medications on File Prior to Visit  Medication Sig Dispense Refill  . amLODipine (NORVASC) 10 MG tablet Take 1 tablet (10 mg total) by mouth daily. 30 tablet 3  . NORTREL 1/35, 28, tablet TAKE 1 TABLET BY MOUTH DAILY 28 tablet 11  . spironolactone (ALDACTONE) 25 MG tablet Take 1 tablet (25 mg total) by mouth daily. 90 tablet 1   No current facility-administered medications on file prior to visit.    BP 123/79 (BP Location: Right Arm, Patient Position: Sitting, Cuff Size: Small)   Pulse (!) 107   Temp 97.8 F (36.6 C) (Temporal)   Resp 16   Ht 5\' 4"  (1.626 m)   Wt 185 lb (83.9  kg)   SpO2 99%   BMI 31.76 kg/m       Objective:   Physical Exam Constitutional:      Appearance: She is well-developed.  Cardiovascular:     Rate and Rhythm: Normal rate and regular rhythm.     Heart sounds: Normal heart sounds. No murmur.  Pulmonary:     Effort: Pulmonary effort is normal. No respiratory distress.     Breath sounds: Normal breath sounds. No wheezing.  Psychiatric:        Behavior: Behavior normal.        Thought Content: Thought content normal.        Judgment: Judgment normal.           Assessment & Plan:  HTN- follow up bp is improved. Continue amlodipine 10mg .   Hx of anemia- follow up H/H normal  Hyperglycemia- follow up A1C was improved. Commended pt on this.  Patient was advised to quarantine as follows following positive COVID-19 result:  At least 10 days have passed since symptom onset and At least 24 hours have passed since resolution of fever without the use of fever-reducing medications and Other symptoms have improved.'

## 2019-09-09 ENCOUNTER — Other Ambulatory Visit: Payer: Self-pay | Admitting: Obstetrics

## 2019-09-09 DIAGNOSIS — Z3009 Encounter for other general counseling and advice on contraception: Secondary | ICD-10-CM

## 2019-09-09 DIAGNOSIS — Z3041 Encounter for surveillance of contraceptive pills: Secondary | ICD-10-CM

## 2019-09-13 ENCOUNTER — Encounter: Payer: Self-pay | Admitting: Family

## 2019-09-13 ENCOUNTER — Other Ambulatory Visit: Payer: Self-pay

## 2019-09-13 MED ORDER — SPIRONOLACTONE 25 MG PO TABS: 25.0000 mg | ORAL_TABLET | Freq: Every day | ORAL | 1 refills | Status: DC

## 2019-09-21 ENCOUNTER — Encounter: Payer: Self-pay | Admitting: Family

## 2019-09-21 ENCOUNTER — Ambulatory Visit: Payer: Federal, State, Local not specified - PPO | Admitting: Family

## 2019-09-21 ENCOUNTER — Other Ambulatory Visit: Payer: Self-pay

## 2019-09-21 VITALS — BP 136/86 | HR 102 | Temp 97.2°F | Resp 16 | Ht 64.0 in | Wt 184.0 lb

## 2019-09-21 DIAGNOSIS — L659 Nonscarring hair loss, unspecified: Secondary | ICD-10-CM

## 2019-09-21 DIAGNOSIS — Z0000 Encounter for general adult medical examination without abnormal findings: Secondary | ICD-10-CM | POA: Diagnosis not present

## 2019-09-21 DIAGNOSIS — E785 Hyperlipidemia, unspecified: Secondary | ICD-10-CM | POA: Diagnosis not present

## 2019-09-21 DIAGNOSIS — M7021 Olecranon bursitis, right elbow: Secondary | ICD-10-CM

## 2019-09-21 NOTE — Patient Instructions (Signed)
Please complete lab work prior to leaving. Continue to work on [employer], regular exercise and weight loss.

## 2019-09-21 NOTE — Progress Notes (Signed)
Subjective:    Patient ID: Victoria Mejia, female    DOB: Feb 23, 1971, 49 y.o.   MRN: [id number]  HPI  Patient presents today for complete physical.  Immunizations:  Will receive 1st covid shot today at [place]:  Needs some improvement Wt Readings from Last 3 Encounters:  09/21/19 184 lb (83.5 kg)  08/25/19 185 lb (83.9 kg)  07/14/19 184 lb (83.5 kg)  Exercise: not currently  Pap Smear: scheduled with GYN Mammogram: March 2021 Vision: scheduled Dental: scheduled  Hair loss- reports ongoing issues with thinning hair x 1 year, improving but not resolved. Requesting referral.  R elbow swelling- reports that she had an injury in the end of march and has continued to have swelling of the right elbow.      Review of Systems  Constitutional: Negative for unexpected weight change.  HENT: Negative for hearing loss and rhinorrhea.   Eyes: Negative for visual disturbance.  Respiratory: Negative for cough and shortness of breath.   Cardiovascular: Negative for chest pain.  Gastrointestinal: Negative for constipation and diarrhea.  Genitourinary: Negative for dysuria, hematuria and menstrual problem.  Musculoskeletal: Negative for arthralgias and myalgias.  Skin: Negative for rash.  Neurological: Negative for headaches.  Hematological: Negative for adenopathy.  Psychiatric/Behavioral:       Denies depression/anxiety   Past Medical History:  Diagnosis Date  . History of chicken pox   . Hyperlipidemia   . Hypertension      Social History   Socioeconomic History  . Marital status: Single    Spouse name: Not on file  . Number of children: Not on file  . Years of education: Not on file  . Highest education level: Not on file  Occupational History  . Not on file  Tobacco Use  . Smoking status: Never Smoker  . Smokeless tobacco: Never Used  Substance and Sexual Activity  . Alcohol use: Yes    Alcohol/week: 0.0 standard drinks    Comment: occasional  . Drug  use: No  . Sexual activity: Yes    Partners: Male    Birth control/protection: Pill  Other Topics Concern  . Not on file  Social History Narrative   Single, has live in boyfriend    Daughter 2005    Works in [employer]   Completed college   No pets   Enjoys television   Social Determinants of Radio broadcast assistant Strain:   . Difficulty of Paying Living Expenses:   Food Insecurity:   . Worried About Charity fundraiser in the Last Year:   . Arboriculturist in the Last Year:   Transportation Needs:   . Film/video editor (Medical):   Marland Kitchen Lack of Transportation (Non-Medical):   Physical Activity:   . Days of Exercise per Week:   . Minutes of Exercise per Session:   Stress:   . Feeling of Stress :   Social Connections:   . Frequency of Communication with Friends and Family:   . Frequency of Social Gatherings with Friends and Family:   . Attends Religious Services:   . Active Member of Clubs or Organizations:   . Attends Archivist Meetings:   Marland Kitchen Marital Status:   Intimate Partner Violence:   . Fear of Current or Ex-Partner:   . Emotionally Abused:   Marland Kitchen Physically Abused:   . Sexually Abused:     Past Surgical History:  Procedure Laterality Date  . CESAREAN SECTION  2005  .  HYSTEROSCOPY N/A 01/25/2015   Procedure: HYSTEROSCOPY;  Surgeon: Shelly Bombard, MD;  Location: [facility];  Service: Gynecology;  Laterality: N/A;  . IUD REMOVAL N/A 01/25/2015   Procedure: INTRAUTERINE DEVICE (IUD) REMOVAL;  Surgeon: Shelly Bombard, MD;  Location: [facility];  Service: Gynecology;  Laterality: N/A;    Family History  Problem Relation Age of Onset  . Hypertension Father   . Alcohol abuse Father   . Cirrhosis Father   . Stroke Maternal Grandmother   . Arthritis Mother   . Hyperlipidemia Mother   . Heart disease Maternal Uncle 50    No Known Allergies  Current Outpatient Medications on File Prior to Visit  Medication Sig Dispense Refill  . amLODipine (NORVASC) 10 MG  tablet Take 1 tablet (10 mg total) by mouth daily. 30 tablet 3  . NORTREL 1/35, 28, tablet TAKE 1 TABLET BY MOUTH DAILY 28 tablet 11  . spironolactone (ALDACTONE) 25 MG tablet Take 1 tablet (25 mg total) by mouth daily. 90 tablet 1   No current facility-administered medications on file prior to visit.    BP (!) 134/93 (BP Location: Right Arm, Patient Position: Sitting, Cuff Size: Small)   Pulse (!) 102   Temp (!) 97.2 F (36.2 C) (Temporal)   Resp 16   Ht 5\' 4"  (1.626 m)   Wt 184 lb (83.5 kg)   SpO2 100%   BMI 31.58 kg/m       Objective:   Physical Exam  Physical Exam  Constitutional: She is oriented to person, place, and time. She appears well-developed and well-nourished. No distress.  HENT:  Head: Normocephalic and atraumatic.  Right Ear: Tympanic membrane and ear canal normal.  Left Ear: Tympanic membrane and ear canal normal.  Mouth/Throat: Not examined- pt wearing mask. Eyes: Pupils are equal, round, and reactive to light. No scleral icterus.  Neck: Normal range of motion. No thyromegaly present.  Cardiovascular: Normal rate and regular rhythm.   No murmur heard. Pulmonary/Chest: Effort normal and breath sounds normal. No respiratory distress. He has no wheezes. She has no rales. She exhibits no tenderness.  Abdominal: Soft. Bowel sounds are normal. She exhibits no distension and no mass. There is no tenderness. There is no rebound and no guarding.  Musculoskeletal: She exhibits no edema. + swollen olecranon bursa right Lymphadenopathy:    She has no cervical adenopathy.  Neurological: She is alert and oriented to person, place, and time. She has normal patellar reflexes. She exhibits normal muscle tone. Coordination normal.  Skin: Skin is warm and dry. hair is noted to be thin but evenly distributed on scalp Psychiatric: She has a normal mood and affect. Her behavior is normal. Judgment and thought content normal.  Breast/pelvic: deferred         Assessment &  Plan:   Preventative care- pap, mammo up to date. Pt to begin covid vaccine series tonight.  Obtain lipid, LFT, other labs are up to date. Discussed healthy diet, exercise, weight loss.   Hyperlipidemia- check lipid panel/lft.  Thinning hair- refer to dermatology.  Olecranon bursitis- new. Refer to sports medicine for further evaluation.   This visit occurred during the SARS-CoV-2 public health emergency.  Safety protocols were in place, including screening questions prior to the visit, additional usage of staff PPE, and extensive cleaning of exam room while observing appropriate contact time as indicated for disinfecting solutions.       Assessment & Plan:

## 2019-09-22 LAB — LIPID PANEL
Cholesterol: 223 mg/dL — ABNORMAL HIGH (ref 0–200)
HDL: 57.1 mg/dL (ref >39.00)
LDL Cholesterol: 138 mg/dL — ABNORMAL HIGH (ref 0–99)
NonHDL: 165.72
Total CHOL/HDL Ratio: 4
Triglycerides: 140 mg/dL (ref 0.0–149.0)
VLDL: 28 mg/dL (ref 0.0–40.0)

## 2019-09-22 LAB — HEPATIC FUNCTION PANEL
ALT: 12 U/L (ref 0–35)
AST: 14 U/L (ref 0–37)
Albumin: 4.4 g/dL (ref 3.5–5.2)
Alkaline Phosphatase: 84 U/L (ref 39–117)
Bilirubin, Direct: 0.1 mg/dL (ref 0.0–0.3)
Total Bilirubin: 0.5 mg/dL (ref 0.2–1.2)
Total Protein: 7.6 g/dL (ref 6.0–8.3)

## 2019-09-27 DIAGNOSIS — H35033 Hypertensive retinopathy, bilateral: Secondary | ICD-10-CM | POA: Diagnosis not present

## 2019-10-11 ENCOUNTER — Ambulatory Visit (INDEPENDENT_AMBULATORY_CARE_PROVIDER_SITE_OTHER): Payer: Federal, State, Local not specified - PPO | Admitting: Obstetrics

## 2019-10-11 ENCOUNTER — Other Ambulatory Visit (HOSPITAL_COMMUNITY)
Admission: RE | Admit: 2019-10-11 | Discharge: 2019-10-11 | Disposition: A | Discharge status: Home / Self Care | Payer: Federal, State, Local not specified - PPO | Source: Ambulatory Visit | Attending: Obstetrics | Admitting: Obstetrics

## 2019-10-11 ENCOUNTER — Other Ambulatory Visit: Payer: Self-pay

## 2019-10-11 ENCOUNTER — Encounter: Payer: Self-pay | Admitting: Obstetrics

## 2019-10-11 VITALS — BP 128/89 | HR 94 | Ht 64.0 in | Wt 185.2 lb

## 2019-10-11 DIAGNOSIS — Z01419 Encounter for gynecological examination (general) (routine) without abnormal findings: Secondary | ICD-10-CM | POA: Diagnosis not present

## 2019-10-11 DIAGNOSIS — E669 Obesity, unspecified: Secondary | ICD-10-CM

## 2019-10-11 DIAGNOSIS — N898 Other specified noninflammatory disorders of vagina: Secondary | ICD-10-CM

## 2019-10-11 DIAGNOSIS — N946 Dysmenorrhea, unspecified: Secondary | ICD-10-CM | POA: Diagnosis not present

## 2019-10-11 DIAGNOSIS — Z3041 Encounter for surveillance of contraceptive pills: Secondary | ICD-10-CM

## 2019-10-11 DIAGNOSIS — Z6834 Body mass index (BMI) 34.0-34.9, adult: Secondary | ICD-10-CM

## 2019-10-11 DIAGNOSIS — Z1272 Encounter for screening for malignant neoplasm of vagina: Secondary | ICD-10-CM

## 2019-10-11 MED ORDER — IBUPROFEN 800 MG PO TABS: 800.0000 mg | ORAL_TABLET | Freq: Three times a day (TID) | ORAL | 5 refills | Status: DC | PRN

## 2019-10-11 MED ORDER — NORTREL 1/35 (28) 1-35 MG-MCG PO TABS: 1.0000 | ORAL_TABLET | Freq: Every day | ORAL | 11 refills | Status: DC

## 2019-10-11 NOTE — Progress Notes (Signed)
Pt is in the office for annual exam. Last pap 10-31-16 Last mammogram 07-14-19 Pt is currently taking BC pills, LMP 09-27-19. Pt declines abnormal symptoms, and declines std testing. GAD-7= 0

## 2019-10-11 NOTE — Progress Notes (Signed)
Subjective:        Victoria Mejia is a 49 y.o. female here for a routine exam.  Current complaints: None.    Personal health questionnaire:  Is patient Ashkenazi Jewish, have a family history of breast and/or ovarian cancer: no Is there a family history of uterine cancer diagnosed at age < 38, gastrointestinal cancer, urinary tract cancer, family member who is a Field seismologist syndrome-associated carrier: no Is the patient overweight and hypertensive, family history of diabetes, personal history of gestational diabetes, preeclampsia or PCOS: no Is patient over 3, have PCOS,  family history of premature CHD under age 70, diabetes, smoke, have hypertension or peripheral artery disease:  no At any time, has a partner hit, kicked or otherwise hurt or frightened you?: no Over the past 2 weeks, have you felt down, depressed or hopeless?: no Over the past 2 weeks, have you felt little interest or pleasure in doing things?:no   Gynecologic History Patient's last menstrual period was 09/27/2019. Contraception: OCP (estrogen/progesterone) Last Pap: 2018. Results were: normal Last mammogram: 07-14-2019. Results were: normal  Obstetric History OB History  Gravida Para Term Preterm AB Living  2       1 1   SAB TAB Ectopic Multiple Live Births  1       1    # Outcome Date GA Lbr Len/2nd Weight Sex Delivery Anes PTL Lv  2 Gravida 09/04/03    F CS-LTranv EPI  LIV  1 SAB 2004            Past Medical History:  Diagnosis Date   History of chicken pox    Hyperlipidemia    Hypertension     Past Surgical History:  Procedure Laterality Date   CESAREAN SECTION  2005   HYSTEROSCOPY N/A 01/25/2015   Procedure: HYSTEROSCOPY;  Surgeon: Shelly Bombard, MD;  Location: [facility];  Service: Gynecology;  Laterality: N/A;   IUD REMOVAL N/A 01/25/2015   Procedure: INTRAUTERINE DEVICE (IUD) REMOVAL;  Surgeon: Shelly Bombard, MD;  Location: [facility];  Service: Gynecology;  Laterality: N/A;      Current Outpatient Medications:    amLODipine (NORVASC) 10 MG tablet, Take 1 tablet (10 mg total) by mouth daily., Disp: 30 tablet, Rfl: 3   norethindrone-ethinyl estradiol 1/35 (NORTREL 1/35, 28,) tablet, Take 1 tablet by mouth daily., Disp: 28 tablet, Rfl: 11   spironolactone (ALDACTONE) 25 MG tablet, Take 1 tablet (25 mg total) by mouth daily., Disp: 90 tablet, Rfl: 1   ibuprofen (ADVIL) 800 MG tablet, Take 1 tablet (800 mg total) by mouth every 8 (eight) hours as needed., Disp: 30 tablet, Rfl: 5 No Known Allergies  Social History   Tobacco Use   Smoking status: Never Smoker   Smokeless tobacco: Never Used  Substance Use Topics   Alcohol use: Yes    Alcohol/week: 0.0 standard drinks    Comment: occasional    Family History  Problem Relation Age of Onset   Hypertension Father    Alcohol abuse Father    Cirrhosis Father    Stroke Maternal Grandmother    Arthritis Mother    Hyperlipidemia Mother    Heart disease Maternal Uncle 49      Review of Systems  Constitutional: negative for fatigue and weight loss Respiratory: negative for cough and wheezing Cardiovascular: negative for chest pain, fatigue and palpitations Gastrointestinal: negative for abdominal pain and change in bowel habits Musculoskeletal:negative for myalgias Neurological: negative for gait problems and tremors Behavioral/Psych: negative for  abusive relationship, depression Endocrine: negative for temperature intolerance    Genitourinary:negative for abnormal menstrual periods, genital lesions, hot flashes, sexual problems and vaginal discharge Integument/breast: negative for breast lump, breast tenderness, nipple discharge and skin lesion(s)    Objective:       BP 128/89    Pulse 94    Ht 5\' 4"  (1.626 m)    Wt 185 lb 3.2 oz (84 kg)    LMP 09/27/2019    BMI 31.79 kg/m  General:   alert  Skin:   no rash or abnormalities  Lungs:   clear to auscultation bilaterally  Heart:   regular rate  and rhythm, S1, S2 normal, no murmur, click, rub or gallop  Breasts:   normal without suspicious masses, skin or nipple changes or axillary nodes  Abdomen:  normal findings: no organomegaly, soft, non-tender and no hernia  Pelvis:  External genitalia: normal general appearance Urinary system: urethral meatus normal and bladder without fullness, nontender Vaginal: normal without tenderness, induration or masses Cervix: normal appearance Adnexa: normal bimanual exam Uterus: anteverted and non-tender, normal size   Lab Review Urine pregnancy test Labs reviewed yes Radiologic studies reviewed yes  50% of 20 min visit spent on counseling and coordination of care.   Assessment:     1. Encounter for routine gynecological examination with Papanicolaou smear of cervix Rx: - Cytology - PAP( [place])  2. Vaginal discharge Rx: - Cervicovaginal ancillary only  3. Encounter for surveillance of contraceptive pills Rx: - norethindrone-ethinyl estradiol 1/35 (NORTREL 1/35, 28,) tablet; Take 1 tablet by mouth daily.  Dispense: 28 tablet; Refill: 11  4. Dysmenorrhea Rx: - ibuprofen (ADVIL) 800 MG tablet; Take 1 tablet (800 mg total) by mouth every 8 (eight) hours as needed.  Dispense: 30 tablet; Refill: 5  5. Obesity (BMI 30.0-34.9) - program of caloric reduction, exercise and behavioral modification recommended    Plan:    Education reviewed: calcium supplements, depression evaluation, low fat, low cholesterol diet, safe sex/STD prevention, self breast exams and weight bearing exercise. Contraception: OCP (estrogen/progesterone). Follow up in: 1 year.   Meds ordered this encounter  Medications   norethindrone-ethinyl estradiol 1/35 (NORTREL 1/35, 28,) tablet    Sig: Take 1 tablet by mouth daily.    Dispense:  28 tablet    Refill:  11   ibuprofen (ADVIL) 800 MG tablet    Sig: Take 1 tablet (800 mg total) by mouth every 8 (eight) hours as needed.    Dispense:  30 tablet     Refill:  5     Shelly Bombard, MD 10/11/2019 9:45 AM

## 2019-10-12 LAB — CYTOLOGY - PAP
Comment: NEGATIVE
Diagnosis: NEGATIVE
High risk HPV: NEGATIVE

## 2019-10-13 LAB — CERVICOVAGINAL ANCILLARY ONLY
Bacterial Vaginitis (gardnerella): NEGATIVE
Candida Glabrata: NEGATIVE
Candida Vaginitis: NEGATIVE
Comment: NEGATIVE
Comment: NEGATIVE
Comment: NEGATIVE

## 2019-10-19 ENCOUNTER — Ambulatory Visit: Payer: Federal, State, Local not specified - PPO | Admitting: Family Medicine

## 2019-10-19 DIAGNOSIS — L650 Telogen effluvium: Secondary | ICD-10-CM | POA: Diagnosis not present

## 2019-10-19 DIAGNOSIS — L218 Other seborrheic dermatitis: Secondary | ICD-10-CM | POA: Diagnosis not present

## 2019-10-26 ENCOUNTER — Ambulatory Visit: Payer: Federal, State, Local not specified - PPO | Admitting: Family Medicine

## 2019-10-26 ENCOUNTER — Other Ambulatory Visit: Payer: Self-pay

## 2019-10-26 ENCOUNTER — Encounter: Payer: Self-pay | Admitting: Family Medicine

## 2019-10-26 VITALS — BP 139/90 | Ht 64.0 in | Wt 187.0 lb

## 2019-10-26 DIAGNOSIS — M7021 Olecranon bursitis, right elbow: Secondary | ICD-10-CM

## 2019-10-26 NOTE — Assessment & Plan Note (Signed)
Has been ongoing for a few months.  Has improved significantly.  Mildly tender still to palpation. -Counseled on home exercise therapy and supportive care. -Counseled on compression. -Could consider injection if needed.

## 2019-10-26 NOTE — Progress Notes (Signed)
Victoria Mejia - 49 y.o. female MRN [id number]  Date of birth: [birth year]  SUBJECTIVE:  Including CC & ROS.  No chief complaint on file.   Victoria Mejia is a 49 y.o. female that is presenting with right elbow pain.  The pain is posterior in nature.  It was initially much larger than left, painful.  It is still tender if she presses on the area.  She has tried compression and medications.  No history of similar pain.  No history of surgery.   Review of Systems See HPI   HISTORY: Past Medical, Surgical, Social, and Family History Reviewed & Updated per EMR.   Pertinent Historical Findings include:  Past Medical History:  Diagnosis Date  . History of chicken pox   . Hyperlipidemia   . Hypertension     Past Surgical History:  Procedure Laterality Date  . CESAREAN SECTION  2005  . HYSTEROSCOPY N/A 01/25/2015   Procedure: HYSTEROSCOPY;  Surgeon: Shelly Bombard, MD;  Location: [facility];  Service: Gynecology;  Laterality: N/A;  . IUD REMOVAL N/A 01/25/2015   Procedure: INTRAUTERINE DEVICE (IUD) REMOVAL;  Surgeon: Shelly Bombard, MD;  Location: [facility];  Service: Gynecology;  Laterality: N/A;    Family History  Problem Relation Age of Onset  . Hypertension Father   . Alcohol abuse Father   . Cirrhosis Father   . Stroke Maternal Grandmother   . Arthritis Mother   . Hyperlipidemia Mother   . Heart disease Maternal Uncle 3    Social History   Socioeconomic History  . Marital status: Single    Spouse name: Not on file  . Number of children: Not on file  . Years of education: Not on file  . Highest education level: Not on file  Occupational History  . Not on file  Tobacco Use  . Smoking status: Never Smoker  . Smokeless tobacco: Never Used  Substance and Sexual Activity  . Alcohol use: Yes    Alcohol/week: 0.0 standard drinks    Comment: occasional  . Drug use: No  . Sexual activity: Yes    Partners: Male    Birth control/protection: Pill  Other Topics  Concern  . Not on file  Social History Narrative   Single, has live in boyfriend    Daughter 2005    Works in [employer]   Completed college   No pets   Enjoys television   Social Determinants of Radio broadcast assistant Strain:   . Difficulty of Paying Living Expenses:   Food Insecurity:   . Worried About Charity fundraiser in the Last Year:   . Arboriculturist in the Last Year:   Transportation Needs:   . Film/video editor (Medical):   Marland Kitchen Lack of Transportation (Non-Medical):   Physical Activity:   . Days of Exercise per Week:   . Minutes of Exercise per Session:   Stress:   . Feeling of Stress :   Social Connections:   . Frequency of Communication with Friends and Family:   . Frequency of Social Gatherings with Friends and Family:   . Attends Religious Services:   . Active Member of Clubs or Organizations:   . Attends Archivist Meetings:   Marland Kitchen Marital Status:   Intimate Partner Violence:   . Fear of Current or Ex-Partner:   . Emotionally Abused:   Marland Kitchen Physically Abused:   . Sexually Abused:      PHYSICAL EXAM:  VS:  LMP 09/27/2019  Physical Exam Gen: NAD, alert, cooperative with exam, well-appearing MSK:  Right elbow: Normal range of motion. No significant bursitis at this time. Tenderness to palpation over the left bursa. No instability. Neurovascular intact     ASSESSMENT & PLAN:   Olecranon bursitis of right elbow Has been ongoing for a few months.  Has improved significantly.  Mildly tender still to palpation. -Counseled on home exercise therapy and supportive care. -Counseled on compression. -Could consider injection if needed.

## 2019-10-26 NOTE — Patient Instructions (Signed)
Nice to meet you Please try ice every so often  Please avoid leaning on the elbow   Please send me a message in [facility] with any questions or updates.  Please see me back in 4 weeks or as needed.   --Dr. Raeford Razor

## 2019-11-11 ENCOUNTER — Telehealth: Payer: Self-pay | Admitting: *Deleted

## 2019-11-11 MED ORDER — AMLODIPINE BESYLATE 10 MG PO TABS: 10.0000 mg | ORAL_TABLET | Freq: Every day | ORAL | 5 refills | Status: DC

## 2019-11-11 NOTE — Telephone Encounter (Signed)
Received request from [facility], Starling Manns for amlodipine refill. Refill sent.

## 2019-12-22 ENCOUNTER — Ambulatory Visit: Payer: Federal, State, Local not specified - PPO | Admitting: Family

## 2019-12-29 ENCOUNTER — Ambulatory Visit: Payer: Federal, State, Local not specified - PPO | Admitting: Family

## 2019-12-31 ENCOUNTER — Encounter: Payer: Self-pay | Admitting: Family

## 2019-12-31 ENCOUNTER — Ambulatory Visit: Payer: Federal, State, Local not specified - PPO | Admitting: Family

## 2019-12-31 ENCOUNTER — Other Ambulatory Visit: Payer: Self-pay

## 2019-12-31 VITALS — BP 122/82 | HR 91 | Temp 99.4°F | Resp 16 | Ht 64.0 in | Wt 187.0 lb

## 2019-12-31 DIAGNOSIS — Z1159 Encounter for screening for other viral diseases: Secondary | ICD-10-CM | POA: Diagnosis not present

## 2019-12-31 DIAGNOSIS — M7021 Olecranon bursitis, right elbow: Secondary | ICD-10-CM | POA: Diagnosis not present

## 2019-12-31 DIAGNOSIS — I10 Essential (primary) hypertension: Secondary | ICD-10-CM

## 2019-12-31 NOTE — Progress Notes (Signed)
Subjective:    Patient ID: Victoria Mejia, female    DOB: 06-Mar-1971, 49 y.o.   MRN: [id number]  HPI  Patient is a 49 yr old female who presents today for follow up.  HTN- current BP medication includes aldactone, amlodipine. Reports that she had some mild swelling in her feet a few weeks ago but this has resolved.  BP Readings from Last 3 Encounters:  12/31/19 122/82  10/26/19 139/90  10/11/19 128/89   Lab Results  Component Value Date   HGBA1C 5.8 07/14/2019   Wt Readings from Last 3 Encounters:  12/31/19 187 lb (84.8 kg)  10/26/19 187 lb (84.8 kg)  10/11/19 185 lb 3.2 oz (84 kg)   Olecranon bursitis- saw sports medicine. Reports that symptoms are improving.    Review of Systems See HPI  Past Medical History:  Diagnosis Date  . History of chicken pox   . Hyperlipidemia   . Hypertension      Social History   Socioeconomic History  . Marital status: Single    Spouse name: Not on file  . Number of children: Not on file  . Years of education: Not on file  . Highest education level: Not on file  Occupational History  . Not on file  Tobacco Use  . Smoking status: Never Smoker  . Smokeless tobacco: Never Used  Substance and Sexual Activity  . Alcohol use: Yes    Alcohol/week: 0.0 standard drinks    Comment: occasional  . Drug use: No  . Sexual activity: Yes    Partners: Male    Birth control/protection: Pill  Other Topics Concern  . Not on file  Social History Narrative   Single, has live in boyfriend    Daughter 2005    Works in [employer]   Completed college   No pets   Enjoys television   Social Determinants of Radio broadcast assistant Strain:   . Difficulty of Paying Living Expenses: Not on file  Food Insecurity:   . Worried About Charity fundraiser in the Last Year: Not on file  . Ran Out of Food in the Last Year: Not on file  Transportation Needs:   . Lack of Transportation (Medical): Not on file  . Lack of Transportation  (Non-Medical): Not on file  Physical Activity:   . Days of Exercise per Week: Not on file  . Minutes of Exercise per Session: Not on file  Stress:   . Feeling of Stress : Not on file  Social Connections:   . Frequency of Communication with Friends and Family: Not on file  . Frequency of Social Gatherings with Friends and Family: Not on file  . Attends Religious Services: Not on file  . Active Member of Clubs or Organizations: Not on file  . Attends Archivist Meetings: Not on file  . Marital Status: Not on file  Intimate Partner Violence:   . Fear of Current or Ex-Partner: Not on file  . Emotionally Abused: Not on file  . Physically Abused: Not on file  . Sexually Abused: Not on file    Past Surgical History:  Procedure Laterality Date  . CESAREAN SECTION  2005  . HYSTEROSCOPY N/A 01/25/2015   Procedure: HYSTEROSCOPY;  Surgeon: Shelly Bombard, MD;  Location: [facility];  Service: Gynecology;  Laterality: N/A;  . IUD REMOVAL N/A 01/25/2015   Procedure: INTRAUTERINE DEVICE (IUD) REMOVAL;  Surgeon: Shelly Bombard, MD;  Location: [facility];  Service: Gynecology;  Laterality: N/A;    Family History  Problem Relation Age of Onset  . Hypertension Father   . Alcohol abuse Father   . Cirrhosis Father   . Stroke Maternal Grandmother   . Arthritis Mother   . Hyperlipidemia Mother   . Heart disease Maternal Uncle 50    No Known Allergies  Current Outpatient Medications on File Prior to Visit  Medication Sig Dispense Refill  . amLODipine (NORVASC) 10 MG tablet Take 1 tablet (10 mg total) by mouth daily. 30 tablet 5  . ibuprofen (ADVIL) 800 MG tablet Take 1 tablet (800 mg total) by mouth every 8 (eight) hours as needed. 30 tablet 5  . norethindrone-ethinyl estradiol 1/35 (NORTREL 1/35, 28,) tablet Take 1 tablet by mouth daily. 28 tablet 11  . spironolactone (ALDACTONE) 25 MG tablet Take 1 tablet (25 mg total) by mouth daily. 90 tablet 1   No current facility-administered  medications on file prior to visit.    BP 122/82 (BP Location: Right Arm, Patient Position: Sitting, Cuff Size: Small)   Pulse 91   Temp 99.4 F (37.4 C) (Oral)   Resp 16   Ht 5\' 4"  (1.626 m)   Wt 187 lb (84.8 kg)   SpO2 99%   BMI 32.10 kg/m       Objective:   Physical Exam Constitutional:      Appearance: She is well-developed.  Cardiovascular:     Rate and Rhythm: Normal rate and regular rhythm.     Heart sounds: Normal heart sounds. No murmur heard.   Pulmonary:     Effort: Pulmonary effort is normal. No respiratory distress.     Breath sounds: Normal breath sounds. No wheezing.  Psychiatric:        Behavior: Behavior normal.        Thought Content: Thought content normal.        Judgment: Judgment normal.           Assessment & Plan:  HTN- bp stable on current regimen. Continue same. Obtain bmet.  Need for hep c screening- check hep C.  Olecranon bursitis- improved. Monitor.  This visit occurred during the SARS-CoV-2 public health emergency.  Safety protocols were in place, including screening questions prior to the visit, additional usage of staff PPE, and extensive cleaning of exam room while observing appropriate contact time as indicated for disinfecting solutions.

## 2020-01-03 LAB — HEPATITIS C ANTIBODY
Hepatitis C Ab: NONREACTIVE
SIGNAL TO CUT-OFF: 0 (ref <1.00)

## 2020-01-03 LAB — BASIC METABOLIC PANEL
BUN: 8 mg/dL (ref 7–25)
CO2: 24 mmol/L (ref 20–32)
Calcium: 9.4 mg/dL (ref 8.6–10.2)
Chloride: 102 mmol/L (ref 98–110)
Creat: 0.66 mg/dL (ref 0.50–1.10)
Glucose, Bld: 114 mg/dL — ABNORMAL HIGH (ref 65–99)
Potassium: 4.4 mmol/L (ref 3.5–5.3)
Sodium: 139 mmol/L (ref 135–146)

## 2020-02-22 ENCOUNTER — Other Ambulatory Visit: Payer: Self-pay | Admitting: Family

## 2020-04-26 DIAGNOSIS — Z20822 Contact with and (suspected) exposure to covid-19: Secondary | ICD-10-CM | POA: Diagnosis not present

## 2020-05-28 ENCOUNTER — Other Ambulatory Visit: Payer: Self-pay | Admitting: Family

## 2020-06-22 DIAGNOSIS — Z20822 Contact with and (suspected) exposure to covid-19: Secondary | ICD-10-CM | POA: Diagnosis not present

## 2020-06-30 DIAGNOSIS — Z03818 Encounter for observation for suspected exposure to other biological agents ruled out: Secondary | ICD-10-CM | POA: Diagnosis not present

## 2020-06-30 DIAGNOSIS — Z20822 Contact with and (suspected) exposure to covid-19: Secondary | ICD-10-CM | POA: Diagnosis not present

## 2020-07-02 ENCOUNTER — Encounter: Payer: Self-pay | Admitting: Family

## 2020-07-31 ENCOUNTER — Other Ambulatory Visit: Payer: Self-pay | Admitting: Obstetrics

## 2020-07-31 DIAGNOSIS — Z3041 Encounter for surveillance of contraceptive pills: Secondary | ICD-10-CM

## 2020-08-21 ENCOUNTER — Other Ambulatory Visit: Payer: Self-pay | Admitting: Family

## 2020-08-21 NOTE — Telephone Encounter (Signed)
Left detail message for patient to be aware she needs to call our office for follow up appointment

## 2020-08-21 NOTE — Telephone Encounter (Signed)
I sent a 30 day supply, but she is overdue for follow up. Please call her to schedule follow up.

## 2020-09-05 ENCOUNTER — Other Ambulatory Visit: Payer: Self-pay | Admitting: Family

## 2020-09-05 DIAGNOSIS — Z1231 Encounter for screening mammogram for malignant neoplasm of breast: Secondary | ICD-10-CM

## 2020-09-19 ENCOUNTER — Other Ambulatory Visit: Payer: Self-pay | Admitting: Family

## 2020-10-21 DIAGNOSIS — Z20822 Contact with and (suspected) exposure to covid-19: Secondary | ICD-10-CM | POA: Diagnosis not present

## 2020-10-27 ENCOUNTER — Other Ambulatory Visit: Payer: Self-pay

## 2020-10-27 ENCOUNTER — Ambulatory Visit (INDEPENDENT_AMBULATORY_CARE_PROVIDER_SITE_OTHER): Payer: Federal, State, Local not specified - PPO | Admitting: Family

## 2020-10-27 ENCOUNTER — Encounter: Payer: Self-pay | Admitting: Family

## 2020-10-27 ENCOUNTER — Ambulatory Visit: Payer: Federal, State, Local not specified - PPO | Admitting: Obstetrics

## 2020-10-27 ENCOUNTER — Ambulatory Visit
Admission: RE | Admit: 2020-10-27 | Discharge: 2020-10-27 | Disposition: A | Discharge status: Home / Self Care | Payer: Federal, State, Local not specified - PPO | Source: Ambulatory Visit | Attending: Family | Admitting: Family

## 2020-10-27 VITALS — BP 123/83 | HR 104 | Temp 99.8°F | Resp 16 | Ht 64.0 in | Wt 188.0 lb

## 2020-10-27 DIAGNOSIS — I10 Essential (primary) hypertension: Secondary | ICD-10-CM

## 2020-10-27 DIAGNOSIS — D75839 Thrombocytosis, unspecified: Secondary | ICD-10-CM

## 2020-10-27 DIAGNOSIS — Z3041 Encounter for surveillance of contraceptive pills: Secondary | ICD-10-CM

## 2020-10-27 DIAGNOSIS — Z1231 Encounter for screening mammogram for malignant neoplasm of breast: Secondary | ICD-10-CM

## 2020-10-27 DIAGNOSIS — E785 Hyperlipidemia, unspecified: Secondary | ICD-10-CM

## 2020-10-27 DIAGNOSIS — Z0000 Encounter for general adult medical examination without abnormal findings: Secondary | ICD-10-CM | POA: Diagnosis not present

## 2020-10-27 LAB — CBC WITH DIFFERENTIAL/PLATELET
Absolute Monocytes: 562 cells/uL (ref 200–950)
Basophils Absolute: 59 cells/uL (ref 0–200)
Basophils Relative: 0.8 %
Eosinophils Absolute: 111 cells/uL (ref 15–500)
Eosinophils Relative: 1.5 %
HCT: 43.4 % (ref 35.0–45.0)
Hemoglobin: 13.8 g/dL (ref 11.7–15.5)
Lymphs Abs: 2953 cells/uL (ref 850–3900)
MCH: 25.5 pg — ABNORMAL LOW (ref 27.0–33.0)
MCHC: 31.8 g/dL — ABNORMAL LOW (ref 32.0–36.0)
MCV: 80.2 fL (ref 80.0–100.0)
MPV: 9.9 fL (ref 7.5–12.5)
Monocytes Relative: 7.6 %
Neutro Abs: 3715 cells/uL (ref 1500–7800)
Neutrophils Relative %: 50.2 %
Platelets: 483 10*3/uL — ABNORMAL HIGH (ref 140–400)
RBC: 5.41 10*6/uL — ABNORMAL HIGH (ref 3.80–5.10)
RDW: 14.2 % (ref 11.0–15.0)
Total Lymphocyte: 39.9 %
WBC: 7.4 10*3/uL (ref 3.8–10.8)

## 2020-10-27 LAB — LIPID PANEL
Cholesterol: 246 mg/dL — ABNORMAL HIGH (ref <200)
HDL: 57 mg/dL (ref >=50)
LDL Cholesterol (Calc): 158 mg/dL (calc) — ABNORMAL HIGH
Non-HDL Cholesterol (Calc): 189 mg/dL (calc) — ABNORMAL HIGH (ref <130)
Total CHOL/HDL Ratio: 4.3 (calc) (ref <5.0)
Triglycerides: 179 mg/dL — ABNORMAL HIGH (ref <150)

## 2020-10-27 LAB — COMPREHENSIVE METABOLIC PANEL
AG Ratio: 1.7 (calc) (ref 1.0–2.5)
ALT: 23 U/L (ref 6–29)
AST: 20 U/L (ref 10–35)
Albumin: 4.5 g/dL (ref 3.6–5.1)
Alkaline phosphatase (APISO): 95 U/L (ref 31–125)
BUN: 8 mg/dL (ref 7–25)
CO2: 27 mmol/L (ref 20–32)
Calcium: 10.4 mg/dL — ABNORMAL HIGH (ref 8.6–10.2)
Chloride: 104 mmol/L (ref 98–110)
Creat: 0.6 mg/dL (ref 0.50–1.10)
Globulin: 2.7 g/dL (calc) (ref 1.9–3.7)
Glucose, Bld: 95 mg/dL (ref 65–99)
Potassium: 4.4 mmol/L (ref 3.5–5.3)
Sodium: 141 mmol/L (ref 135–146)
Total Bilirubin: 0.4 mg/dL (ref 0.2–1.2)
Total Protein: 7.2 g/dL (ref 6.1–8.1)

## 2020-10-27 MED ORDER — NORTREL 1/35 (28) 1-35 MG-MCG PO TABS
1.0000 | ORAL_TABLET | Freq: Every day | ORAL | 0 refills | Status: DC
Start: 2020-10-27 — End: 2020-11-14

## 2020-10-27 MED ORDER — AMLODIPINE BESYLATE 10 MG PO TABS: ORAL_TABLET | ORAL | 1 refills | Status: DC

## 2020-10-27 NOTE — Patient Instructions (Addendum)
Please complete lab work prior to leaving. Continue to work on healthy diet, exercise and weight loss.  

## 2020-10-27 NOTE — Progress Notes (Signed)
Pt needs BC rf. Annual scheduled in July.

## 2020-10-27 NOTE — Assessment & Plan Note (Addendum)
Immunizations reviewed and up to date.  Pap and mammo up to date. Discussed healthy diet, exercise and weight loss.

## 2020-10-27 NOTE — Progress Notes (Signed)
Subjective:   By signing my name below, I, Victoria Mejia, attest that this documentation has been prepared under the direction and in the presence of [name] NP. 10/27/2020      Patient ID: Victoria Mejia, female    DOB: [birth year], 50 y.o.   MRN: [id number]  Chief Complaint  Patient presents with   Annual Exam     HPI Patient is in today for a comprehensive physical exam. She reports that she had diarrhea this week. She thinks its because she ate some bad fast food. She has mostly recovered since she first contracted it but still feels her stomach is affected. She also notes developing a new skin tag on her body. It does not bother her at this time. She denies having any unexpected weight change, hearing loss and rhinorrhea, visual disturbance, cough, chest pain and leg swelling, nausea, vomiting, blood in stool, or dysuria and frequency, hematuria, for myalgias and arthralgias, rash, headaches, adenopathy, depression or anxiety at this time. She has had no recent surgeries. She has had no recent changes to her family medical history. She typically drinks 1-2 glasses of wine every week. She does not smoke. She does not use drugs. She does not have a sexual partner.  Hypertension- Her blood pressure is well controlled during this visit. She continues taking 10 mg amlodipine daily PO and 25 mg aldactone daily PO. BP Readings from Last 3 Encounters:  10/27/20 123/83  12/31/19 122/82  10/26/19 139/90   Birth control- She continues taking her birth control pill regularly. She reports her menstrual cycles are light while on it. She reports no new issues while taking her birth control. Her GYN manages her birth control medication.  Immunizations: She is UTD on tetanus vaccines. She has 3 moderna Covid-19 vaccines.  Diet: She has been eating unhealthier since last visit. She is eating more fast food and red meat.  Exercise: She does not participate in regular exercise.   Colonoscopy: Not yet completed. Due. She is interested in making an appointment.  Pap Smear: Last completed 10/11/2019. Results normal. Repeat in 3 years. Mammogram: Last completed 10/27/2020 at 8:40 am. Results pending. Dental: She has scheduled an appointment later this month. Vision: She is not UTD on vision care. She is planning to schedule and appointment at a later time.  Health Maintenance Due  Topic Date Due   COLONOSCOPY (Pts 45-62yr Insurance coverage will need to be confirmed)  Never done   MAMMOGRAM  07/13/2020    Past Medical History:  Diagnosis Date   History of chicken pox    Hyperlipidemia    Hypertension     Past Surgical History:  Procedure Laterality Date   CESAREAN SECTION  2005   HYSTEROSCOPY N/A 01/25/2015   Procedure: HYSTEROSCOPY;  Surgeon: CShelly Bombard MD;  [facility];  Service: Gynecology;  Laterality: N/A;   IUD REMOVAL N/A 01/25/2015   Procedure: INTRAUTERINE DEVICE (IUD) REMOVAL;  Surgeon: CShelly Bombard MD;  [facility];  Service: Gynecology;  Laterality: N/A;    Family History  Problem Relation Age of Onset   Hypertension Father    Alcohol abuse Father    Cirrhosis Father    Stroke Maternal Grandmother    Arthritis Mother    Hyperlipidemia Mother    Heart disease Maternal Uncle [phone]   Social History   Socioeconomic History   Marital status: Single    Spouse name: Not on file   Number of children: Not on  file   Years of education: Not on file   Highest education level: Not on file  Occupational History   Not on file  Tobacco Use   Smoking status: Never   Smokeless tobacco: Never  Substance and Sexual Activity   Alcohol use: Yes    Alcohol/week: 1.0 - 2.0 standard drink    Types: 1 - 2 Glasses of wine per week    Comment: occasional   Drug use: No   Sexual activity: Yes    Partners: Male    Birth control/protection: Pill  Other Topics Concern   Not on file  Social History Narrative   Single, lives with her  daughter   Daughter 2005    Works in [place] for [employer].    Completed college   No pets   Enjoys television   Social Determinants of Radio broadcast assistant Strain: Not on file  Food Insecurity: Not on file  Transportation Needs: Not on file  Physical Activity: Not on file  Stress: Not on file  Social Connections: Not on file  Intimate Partner Violence: Not on file    Outpatient Medications Prior to Visit  Medication Sig Dispense Refill   ibuprofen (ADVIL) 800 MG tablet Take 1 tablet (800 mg total) by mouth every 8 (eight) hours as needed. 30 tablet 5   norethindrone-ethinyl estradiol 1/35 (NORTREL 1/35, 28,) tablet Take 1 tablet by mouth daily. 84 tablet 0   spironolactone (ALDACTONE) 25 MG tablet TAKE 1 TABLET(25 MG) BY MOUTH DAILY 90 tablet 0   amLODipine (NORVASC) 10 MG tablet TAKE 1 TABLET(10 MG) BY MOUTH DAILY 30 tablet 5   No facility-administered medications prior to visit.    No Known Allergies  Review of Systems  Constitutional:        (-)unexpected weight change (-)Adenopathy  HENT:  Negative for hearing loss.        (-)Rhinorrhea   Eyes:        (-)Visual disturbance  Respiratory:  Negative for cough.   Cardiovascular:  Negative for chest pain and leg swelling.  Gastrointestinal:  Negative for blood in stool, constipation, nausea and vomiting.  Genitourinary:  Negative for dysuria, frequency and hematuria.  Musculoskeletal:  Negative for joint pain and myalgias.  Skin:  Negative for rash.       (+)skin tag  Neurological:  Negative for headaches.  Psychiatric/Behavioral:  Negative for depression. The patient is not nervous/anxious.       Objective:    Physical Exam Constitutional:      General: She is not in acute distress.    Appearance: Normal appearance. She is not ill-appearing.  HENT:     Head: Normocephalic and atraumatic.     Right Ear: Tympanic membrane, ear canal and external ear normal.     Left Ear: Tympanic membrane, ear canal and  external ear normal.  Eyes:     Extraocular Movements: Extraocular movements intact.     Pupils: Pupils are equal, round, and reactive to light.     Comments: No nystagmus  Cardiovascular:     Rate and Rhythm: Normal rate and regular rhythm.     Pulses: Normal pulses.     Heart sounds: Normal heart sounds. No murmur heard.   No gallop.  Pulmonary:     Effort: Pulmonary effort is normal. No respiratory distress.     Breath sounds: Normal breath sounds. No wheezing, rhonchi or rales.  Abdominal:     General: Bowel sounds are normal. There is  no distension.     Palpations: Abdomen is soft. There is no mass.     Tenderness: There is no abdominal tenderness. There is no guarding or rebound.     Hernia: No hernia is present.  Musculoskeletal:     Comments: 5/5 strength in both upper and lower extremities  Lymphadenopathy:     Cervical: No cervical adenopathy.  Skin:    General: Skin is warm and dry.  Neurological:     Mental Status: She is alert and oriented to person, place, and time.     Deep Tendon Reflexes:     Reflex Scores:      Patellar reflexes are 2+ on the right side and 2+ on the left side. Psychiatric:        Behavior: Behavior normal.    BP 123/83 (BP Location: Right Arm, Patient Position: Sitting, Cuff Size: Small)   Pulse (!) 104   Temp 99.8 F (37.7 C) (Oral)   Resp 16   Ht _0  (1.626 m)   Wt 188 lb (85.3 kg)   LMP 10/17/2020   SpO2 99%   BMI 32.27 kg/m  Wt Readings from Last 3 Encounters:  10/27/20 188 lb (85.3 kg)  12/31/19 187 lb (84.8 kg)  10/26/19 187 lb (84.8 kg)       Assessment & Plan:   Problem List Items Addressed This Visit       Unprioritized   Preventative health care - Primary    Immunizations reviewed and up to date.  Pap and mammo up to date. Discussed healthy diet, exercise and weight loss.        Relevant Orders   Ambulatory referral to Gastroenterology   Hyperlipidemia   Relevant Medications   amLODipine (NORVASC) 10 MG  tablet   Other Relevant Orders   Lipid panel   HTN (hypertension)    Wt Readings from Last 3 Encounters:  10/27/20 188 lb (85.3 kg)  12/31/19 187 lb (84.8 kg)  10/26/19 187 lb (84.8 kg)   BP Readings from Last 3 Encounters:  10/27/20 123/83  12/31/19 122/82  10/26/19 139/90  Stable on aldactone 2m and amlodipine 167m Continue same.        Relevant Medications   amLODipine (NORVASC) 10 MG tablet   Other Relevant Orders   Comp Met (CMET)   Other Visit Diagnoses     Thrombocytosis       Relevant Orders   CBC with Differential/Platelet        Meds ordered this encounter  Medications   amLODipine (NORVASC) 10 MG tablet    Sig: TAKE 1 TABLET(10 MG) BY MOUTH DAILY    Dispense:  90 tablet    Refill:  1    Order Specific Question:   Supervising Provider    Answer:   BLPenni Homans [[id number]]    I, MeDebbrah AlarP, personally preformed the services described in this documentation.  All medical record entries made by the scribe were at my direction and in my presence.  I have reviewed the chart and discharge instructions (if applicable) and agree that the record reflects my personal performance and is accurate and complete. 10/27/2020   I,Victoria Mejia,acting as a scEducation administratoror MeNance PearNP.,have documented all relevant documentation on the behalf of MeNance PearNP,as directed by  MeNance PearNP while in the presence of MeNance PearNP.   MeNance PearNP

## 2020-10-27 NOTE — Progress Notes (Signed)
Subjective:   By signing my name below, I, Shehryar Baig, attest that this documentation has been prepared under the direction and in the presence of [name] NP. 10/27/2020      Patient ID: Victoria Mejia, female    DOB: [birth year], 50 y.o.   MRN: [id number]  Chief Complaint  Patient presents with   Annual Exam    HPI Patient is in today for a comprehensive physical exam.  Hypertension- maintained on amlodipine and aldactone. Hyperlipidemia- not on statin.   Immunizations:  Diet: needs improvement Exercise: no Colonoscopy: Not yet completed. Due. Pap Smear: Last completed 10/11/2019. Results normal. Repeat in 3 years. Mammogram: Last completed 10/27/2020 at 8:40 am. Results pending. Dental: Vision:  Health Maintenance Due  Topic Date Due   COLONOSCOPY (Pts 45-65yr Insurance coverage will need to be confirmed)  Never done   MAMMOGRAM  07/13/2020    Past Medical History:  Diagnosis Date   History of chicken pox    Hyperlipidemia    Hypertension     Past Surgical History:  Procedure Laterality Date   CESAREAN SECTION  2005   HYSTEROSCOPY N/A 01/25/2015   Procedure: HYSTEROSCOPY;  Surgeon: CShelly Bombard MD;  [facility];  Service: Gynecology;  Laterality: N/A;   IUD REMOVAL N/A 01/25/2015   Procedure: INTRAUTERINE DEVICE (IUD) REMOVAL;  Surgeon: CShelly Bombard MD;  [facility];  Service: Gynecology;  Laterality: N/A;    Family History  Problem Relation Age of Onset   Hypertension Father    Alcohol abuse Father    Cirrhosis Father    Stroke Maternal Grandmother    Arthritis Mother    Hyperlipidemia Mother    Heart disease Maternal Uncle 562   Social History   Socioeconomic History   Marital status: Single    Spouse name: Not on file   Number of children: Not on file   Years of education: Not on file   Highest education level: Not on file  Occupational History   Not on file  Tobacco Use   Smoking status: Never    Smokeless tobacco: Never  Substance and Sexual Activity   Alcohol use: Yes    Alcohol/week: 1.0 - 2.0 standard drink    Types: 1 - 2 Glasses of wine per week    Comment: occasional   Drug use: No   Sexual activity: Yes    Partners: Male    Birth control/protection: Pill  Other Topics Concern   Not on file  Social History Narrative   Single, lives with her daughter   Daughter 2005    Works in [employer]    Completed college   No pets   Enjoys television   Social Determinants of HRadio broadcast assistantStrain: Not on file  Food Insecurity: Not on file  Transportation Needs: Not on file  Physical Activity: Not on file  Stress: Not on file  Social Connections: Not on file  Intimate Partner Violence: Not on file    Outpatient Medications Prior to Visit  Medication Sig Dispense Refill   ibuprofen (ADVIL) 800 MG tablet Take 1 tablet (800 mg total) by mouth every 8 (eight) hours as needed. 30 tablet 5   norethindrone-ethinyl estradiol 1/35 (NORTREL 1/35, 28,) tablet Take 1 tablet by mouth daily. 84 tablet 0   spironolactone (ALDACTONE) 25 MG tablet TAKE 1 TABLET(25 MG) BY MOUTH DAILY 90 tablet 0   amLODipine (NORVASC) 10 MG tablet TAKE 1 TABLET(10 MG)  BY MOUTH DAILY 30 tablet 5   No facility-administered medications prior to visit.    No Known Allergies  Review of Systems  Constitutional:        (-)unexpected weight change (-)Adenopathy  HENT:  Negative for hearing loss.        (-)Rhinorrhea   Eyes:        (-)Visual disturbance  Respiratory:  Negative for cough.   Cardiovascular:  Negative for chest pain and leg swelling.  Gastrointestinal:  Negative for blood in stool, constipation, diarrhea, nausea and vomiting.  Genitourinary:  Negative for dysuria and frequency.  Musculoskeletal:  Negative for joint pain and myalgias.  Skin:  Negative for rash.  Neurological:  Negative for headaches.  Psychiatric/Behavioral:  Negative for depression. The patient  is not nervous/anxious.       Objective:    Physical Exam Constitutional:      General: She is not in acute distress.    Appearance: Normal appearance. She is not ill-appearing.  HENT:     Head: Normocephalic and atraumatic.     Right Ear: Tympanic membrane, ear canal and external ear normal.     Left Ear: Tympanic membrane, ear canal and external ear normal.  Eyes:     Extraocular Movements: Extraocular movements intact.     Pupils: Pupils are equal, round, and reactive to light.     Comments: No nystagmus  Cardiovascular:     Rate and Rhythm: Normal rate and regular rhythm.     Pulses: Normal pulses.     Heart sounds: Normal heart sounds. No murmur heard.   No gallop.  Pulmonary:     Effort: Pulmonary effort is normal. No respiratory distress.     Breath sounds: Normal breath sounds. No wheezing, rhonchi or rales.  Abdominal:     General: Bowel sounds are normal. There is no distension.     Palpations: Abdomen is soft. There is no mass.     Tenderness: There is no abdominal tenderness. There is no guarding or rebound.     Hernia: No hernia is present.  Musculoskeletal:     Comments: 5/5 strength in both upper and lower extremities  Skin:    General: Skin is warm and dry.  Neurological:     Mental Status: She is alert and oriented to person, place, and time.     Deep Tendon Reflexes:     Reflex Scores:      Patellar reflexes are 2+ on the right side and 2+ on the left side. Psychiatric:        Behavior: Behavior normal.    BP 123/83 (BP Location: Right Arm, Patient Position: Sitting, Cuff Size: Small)   Pulse (!) 104   Temp 99.8 F (37.7 C) (Oral)   Resp 16   Ht _0  (1.626 m)   Wt 188 lb (85.3 kg)   LMP 10/17/2020   SpO2 99%   BMI 32.27 kg/m  Wt Readings from Last 3 Encounters:  10/27/20 188 lb (85.3 kg)  12/31/19 187 lb (84.8 kg)  10/26/19 187 lb (84.8 kg)       Assessment & Plan:   Problem List Items Addressed This Visit       Unprioritized    Preventative health care - Primary    Immunizations reviewed and up to date.         Relevant Orders   Ambulatory referral to Gastroenterology   Hyperlipidemia   Relevant Medications   amLODipine (NORVASC) 10 MG tablet  Other Relevant Orders   Lipid panel   HTN (hypertension)    Wt Readings from Last 3 Encounters:  10/27/20 188 lb (85.3 kg)  12/31/19 187 lb (84.8 kg)  10/26/19 187 lb (84.8 kg)   BP Readings from Last 3 Encounters:  10/27/20 123/83  12/31/19 122/82  10/26/19 139/90  Stable on aldactone 58m and amlodipine 18m Continue same.        Relevant Medications   amLODipine (NORVASC) 10 MG tablet   Other Relevant Orders   Comp Met (CMET)   Other Visit Diagnoses     Thrombocytosis       Relevant Orders   CBC with Differential/Platelet        Meds ordered this encounter  Medications   amLODipine (NORVASC) 10 MG tablet    Sig: TAKE 1 TABLET(10 MG) BY MOUTH DAILY    Dispense:  90 tablet    Refill:  1    Order Specific Question:   Supervising Provider    Answer:   BLPenni Homans [[id number]]    I, MeDebbrah AlarP, personally preformed the services described in this documentation.  All medical record entries made by the scribe were at my direction and in my presence.  I have reviewed the chart and discharge instructions (if applicable) and agree that the record reflects my personal performance and is accurate and complete. 10/27/2020   I,Shehryar Baig,acting as a scEducation administratoror MeNance PearNP.,have documented all relevant documentation on the behalf of MeNance PearNP,as directed by  MeNance PearNP while in the presence of MeNance PearNP.   MeNance PearNP

## 2020-10-27 NOTE — Assessment & Plan Note (Signed)
Wt Readings from Last 3 Encounters:  10/27/20 188 lb (85.3 kg)  12/31/19 187 lb (84.8 kg)  10/26/19 187 lb (84.8 kg)   BP Readings from Last 3 Encounters:  10/27/20 123/83  12/31/19 122/82  10/26/19 139/90   Stable on aldactone 25mg  and amlodipine 10mg . Continue same.

## 2020-10-30 ENCOUNTER — Telehealth: Payer: Self-pay | Admitting: Family

## 2020-10-30 NOTE — Telephone Encounter (Signed)
Calcium is a little high.  Is she taking a potassium supplement?    I would like for her to complete some additional labs to evaluate possible cause for high calcium.   Cholesterol and triglycerides are elevated. Please work on low fat diet, limiting concentrated sweets, white carbs, and regular exercise.

## 2020-10-31 NOTE — Telephone Encounter (Signed)
Patient advised of results and provider's comments, also scheduled for additional labs

## 2020-11-01 ENCOUNTER — Other Ambulatory Visit (INDEPENDENT_AMBULATORY_CARE_PROVIDER_SITE_OTHER): Payer: Federal, State, Local not specified - PPO

## 2020-11-01 ENCOUNTER — Other Ambulatory Visit: Payer: Self-pay

## 2020-11-02 LAB — PTH, INTACT AND CALCIUM
Calcium: 9.6 mg/dL (ref 8.6–10.2)
PTH: 74 pg/mL (ref 16–77)

## 2020-11-14 ENCOUNTER — Other Ambulatory Visit (HOSPITAL_COMMUNITY)
Admission: RE | Admit: 2020-11-14 | Discharge: 2020-11-14 | Disposition: A | Discharge status: Home / Self Care | Payer: Federal, State, Local not specified - PPO | Source: Ambulatory Visit | Attending: Obstetrics | Admitting: Obstetrics

## 2020-11-14 ENCOUNTER — Ambulatory Visit (INDEPENDENT_AMBULATORY_CARE_PROVIDER_SITE_OTHER): Payer: Federal, State, Local not specified - PPO | Admitting: Obstetrics

## 2020-11-14 ENCOUNTER — Encounter: Payer: Self-pay | Admitting: Obstetrics

## 2020-11-14 ENCOUNTER — Other Ambulatory Visit: Payer: Self-pay

## 2020-11-14 VITALS — BP 118/82 | HR 88 | Ht 64.0 in | Wt 190.0 lb

## 2020-11-14 DIAGNOSIS — Z3041 Encounter for surveillance of contraceptive pills: Secondary | ICD-10-CM | POA: Diagnosis not present

## 2020-11-14 DIAGNOSIS — N898 Other specified noninflammatory disorders of vagina: Secondary | ICD-10-CM

## 2020-11-14 DIAGNOSIS — E669 Obesity, unspecified: Secondary | ICD-10-CM

## 2020-11-14 DIAGNOSIS — Z01419 Encounter for gynecological examination (general) (routine) without abnormal findings: Secondary | ICD-10-CM | POA: Insufficient documentation

## 2020-11-14 DIAGNOSIS — Z113 Encounter for screening for infections with a predominantly sexual mode of transmission: Secondary | ICD-10-CM

## 2020-11-14 MED ORDER — NORTREL 1/35 (28) 1-35 MG-MCG PO TABS
1.0000 | ORAL_TABLET | Freq: Every day | ORAL | 3 refills | Status: DC
Start: 2020-11-14 — End: 2021-11-26

## 2020-11-14 NOTE — Progress Notes (Signed)
Subjective:        Victoria Mejia is a 50 y.o. female here for a routine exam.  Current complaints: Vaginal discharge.    Personal health questionnaire:  Is patient Ashkenazi Jewish, have a family history of breast and/or ovarian cancer: no Is there a family history of uterine cancer diagnosed at age < 82, gastrointestinal cancer, urinary tract cancer, family member who is a Field seismologist syndrome-associated carrier: no Is the patient overweight and hypertensive, family history of diabetes, personal history of gestational diabetes, preeclampsia or PCOS: yes Is patient over 59, have PCOS,  family history of premature CHD under age 71, diabetes, smoke, have hypertension or peripheral artery disease:  no At any time, has a partner hit, kicked or otherwise hurt or frightened you?: no Over the past 2 weeks, have you felt down, depressed or hopeless?: no Over the past 2 weeks, have you felt little interest or pleasure in doing things?:no   Gynecologic History Patient's last menstrual period was 10/17/2020. Contraception: OCP (estrogen/progesterone) Last Pap: 10-11-2019. Results were: normal Last mammogram: 10-27-2020. Results were: normal  Obstetric History OB History  Gravida Para Term Preterm AB Living  2       1 1   SAB IAB Ectopic Multiple Live Births  1       1    # Outcome Date GA Lbr Len/2nd Weight Sex Delivery Anes PTL Lv  2 Gravida 09/04/03    F CS-LTranv EPI  LIV  1 SAB 2004            Past Medical History:  Diagnosis Date   History of chicken pox    Hyperlipidemia    Hypertension     Past Surgical History:  Procedure Laterality Date   CESAREAN SECTION  2005   HYSTEROSCOPY N/A 01/25/2015   Procedure: HYSTEROSCOPY;  Surgeon: Shelly Bombard, MD;  Location: [facility];  Service: Gynecology;  Laterality: N/A;   IUD REMOVAL N/A 01/25/2015   Procedure: INTRAUTERINE DEVICE (IUD) REMOVAL;  Surgeon: Shelly Bombard, MD;  Location: [facility];  Service: Gynecology;  Laterality: N/A;      Current Outpatient Medications:    amLODipine (NORVASC) 10 MG tablet, TAKE 1 TABLET(10 MG) BY MOUTH DAILY, Disp: 90 tablet, Rfl: 1   spironolactone (ALDACTONE) 25 MG tablet, TAKE 1 TABLET(25 MG) BY MOUTH DAILY, Disp: 90 tablet, Rfl: 0   ibuprofen (ADVIL) 800 MG tablet, Take 1 tablet (800 mg total) by mouth every 8 (eight) hours as needed., Disp: 30 tablet, Rfl: 5   norethindrone-ethinyl estradiol 1/35 (NORTREL 1/35, 28,) tablet, Take 1 tablet by mouth daily., Disp: 84 tablet, Rfl: 3 No Known Allergies  Social History   Tobacco Use   Smoking status: Never   Smokeless tobacco: Never  Substance Use Topics   Alcohol use: Yes    Alcohol/week: 1.0 - 2.0 standard drink    Types: 1 - 2 Glasses of wine per week    Comment: occasional    Family History  Problem Relation Age of Onset   Hypertension Father    Alcohol abuse Father    Cirrhosis Father    Stroke Maternal Grandmother    Arthritis Mother    Hyperlipidemia Mother    Heart disease Maternal Uncle 52      Review of Systems  Constitutional: negative for fatigue and weight loss Respiratory: negative for cough and wheezing Cardiovascular: negative for chest pain, fatigue and palpitations Gastrointestinal: negative for abdominal pain and change in bowel habits Musculoskeletal:negative for myalgias Neurological:  negative for gait problems and tremors Behavioral/Psych: negative for abusive relationship, depression Endocrine: negative for temperature intolerance    Genitourinary: positive for vaginal discharge.  negative for abnormal menstrual periods, genital lesions, hot flashes, sexual problems  Integument/breast: negative for breast lump, breast tenderness, nipple discharge and skin lesion(s)    Objective:       BP 118/82   Pulse 88   Ht 5\' 4"  (1.626 m)   Wt 190 lb (86.2 kg)   LMP 10/17/2020   BMI 32.61 kg/m  General:   Alert and no distress  Skin:   no rash or abnormalities  Lungs:   clear to auscultation  bilaterally  Heart:   regular rate and rhythm, S1, S2 normal, no murmur, click, rub or gallop  Breasts:   normal without suspicious masses, skin or nipple changes or axillary nodes  Abdomen:  normal findings: no organomegaly, soft, non-tender and no hernia  Pelvis:  External genitalia: normal general appearance Urinary system: urethral meatus normal and bladder without fullness, nontender Vaginal: normal without tenderness, induration or masses Cervix: normal appearance Adnexa: normal bimanual exam Uterus: anteverted and non-tender, normal size   Lab Review Urine pregnancy test Labs reviewed yes Radiologic studies reviewed yes  I have spent a total of 20 minutes of face-to-face time, excluding clinical staff time, reviewing notes and preparing to see patient, ordering tests and/or medications, and counseling the patient.   Assessment:    1. Encounter for routine gynecological examination with Papanicolaou smear of cervix Rx: - Cytology - PAP( [facility])  2. Vaginal discharge Rx: - Cervicovaginal ancillary only( [facility])  3. Screening for STD (sexually transmitted disease) Rx: - RPR+HBsAg+HCVAb+...  4. Encounter for surveillance of contraceptive pills Rx: - norethindrone-ethinyl estradiol 1/35 (NORTREL 1/35, 28,) tablet; Take 1 tablet by mouth daily.  Dispense: 84 tablet; Refill: 3  5. Obesity (BMI 30.0-34.9) - weight reduction with the aid of dietary changes, exercise and behavioral modification recommended    Plan:    Education reviewed: calcium supplements, depression evaluation, low fat, low cholesterol diet, safe sex/STD prevention, self breast exams, and weight bearing exercise. Contraception: OCP (estrogen/progesterone). Follow up in: 1 year.   Meds ordered this encounter  Medications   norethindrone-ethinyl estradiol 1/35 (NORTREL 1/35, 28,) tablet    Sig: Take 1 tablet by mouth daily.    Dispense:  84 tablet    Refill:  3    **Patient requests 90 days  supply**   Orders Placed This Encounter  Procedures   RPR+HBsAg+HCVAb+...     Shelly Bombard, MD 11/14/2020 9:56 AM

## 2020-11-14 NOTE — Progress Notes (Signed)
Pt needs refill on [facility] Rx.

## 2020-11-15 LAB — CERVICOVAGINAL ANCILLARY ONLY
Bacterial Vaginitis (gardnerella): NEGATIVE
Candida Glabrata: NEGATIVE
Candida Vaginitis: NEGATIVE
Chlamydia: NEGATIVE
Comment: NEGATIVE
Comment: NEGATIVE
Comment: NEGATIVE
Comment: NEGATIVE
Comment: NEGATIVE
Comment: NORMAL
Neisseria Gonorrhea: NEGATIVE
Trichomonas: NEGATIVE

## 2020-11-15 LAB — RPR+HBSAG+HCVAB+...
HIV Screen 4th Generation wRfx: NONREACTIVE
Hep C Virus Ab: <0.1 s/co ratio (ref 0.0–0.9)
Hepatitis B Surface Ag: NEGATIVE
RPR Ser Ql: NONREACTIVE

## 2020-11-17 LAB — CYTOLOGY - PAP
Comment: NEGATIVE
Diagnosis: NEGATIVE
High risk HPV: NEGATIVE

## 2020-11-22 DIAGNOSIS — Z20822 Contact with and (suspected) exposure to covid-19: Secondary | ICD-10-CM | POA: Diagnosis not present

## 2020-12-04 DIAGNOSIS — Z20822 Contact with and (suspected) exposure to covid-19: Secondary | ICD-10-CM | POA: Diagnosis not present

## 2020-12-05 ENCOUNTER — Other Ambulatory Visit: Payer: Self-pay | Admitting: Family

## 2020-12-19 DIAGNOSIS — Z20822 Contact with and (suspected) exposure to covid-19: Secondary | ICD-10-CM | POA: Diagnosis not present

## 2021-02-16 DIAGNOSIS — Z20822 Contact with and (suspected) exposure to covid-19: Secondary | ICD-10-CM | POA: Diagnosis not present

## 2021-03-05 ENCOUNTER — Other Ambulatory Visit: Payer: Self-pay | Admitting: Family

## 2021-03-14 DIAGNOSIS — Z20822 Contact with and (suspected) exposure to covid-19: Secondary | ICD-10-CM | POA: Diagnosis not present

## 2021-03-27 DIAGNOSIS — Z20822 Contact with and (suspected) exposure to covid-19: Secondary | ICD-10-CM | POA: Diagnosis not present

## 2021-03-27 DIAGNOSIS — Z03818 Encounter for observation for suspected exposure to other biological agents ruled out: Secondary | ICD-10-CM | POA: Diagnosis not present

## 2021-03-27 DIAGNOSIS — J00 Acute nasopharyngitis [common cold]: Secondary | ICD-10-CM | POA: Diagnosis not present

## 2021-05-01 ENCOUNTER — Ambulatory Visit: Payer: Federal, State, Local not specified - PPO | Admitting: Family

## 2021-05-08 ENCOUNTER — Ambulatory Visit: Payer: Federal, State, Local not specified - PPO | Admitting: Family

## 2021-05-08 ENCOUNTER — Encounter: Payer: Self-pay | Admitting: Family

## 2021-05-08 VITALS — BP 132/83 | HR 100 | Temp 99.5°F | Resp 16 | Ht 64.0 in | Wt 191.0 lb

## 2021-05-08 DIAGNOSIS — Z1211 Encounter for screening for malignant neoplasm of colon: Secondary | ICD-10-CM

## 2021-05-08 DIAGNOSIS — Z23 Encounter for immunization: Secondary | ICD-10-CM | POA: Diagnosis not present

## 2021-05-08 DIAGNOSIS — I10 Essential (primary) hypertension: Secondary | ICD-10-CM | POA: Diagnosis not present

## 2021-05-08 MED ORDER — SPIRONOLACTONE 25 MG PO TABS: ORAL_TABLET | ORAL | 1 refills | Status: DC

## 2021-05-08 MED ORDER — AMLODIPINE BESYLATE 10 MG PO TABS: ORAL_TABLET | ORAL | 1 refills | Status: DC

## 2021-05-08 NOTE — Patient Instructions (Signed)
Please get your bivalent covid booster shot.  Complete lab work prior to leaving.

## 2021-05-08 NOTE — Progress Notes (Signed)
Subjective:     Patient ID: Victoria Mejia, female    DOB: 06-22-1970, 51 y.o.   MRN: [id number]  Chief Complaint  Patient presents with   Hypertension    Here for follow up    HPI Patient is in today for follow up.   HTN- maintained on amlodipine 10mg  and aldactone 25mg . Denies LE edema.   BP Readings from Last 3 Encounters:  05/08/21 132/83  11/14/20 118/82  10/27/20 123/83     Health Maintenance Due  Topic Date Due   COLONOSCOPY (Pts 45-18yrs Insurance coverage will need to be confirmed)  Never done   COVID-19 Vaccine (3 - Booster for Moderna series) 08/06/2020    Past Medical History:  Diagnosis Date   History of chicken pox    Hyperlipidemia    Hypertension     Past Surgical History:  Procedure Laterality Date   CESAREAN SECTION  2005   HYSTEROSCOPY N/A 01/25/2015   Procedure: HYSTEROSCOPY;  Surgeon: Shelly Bombard, MD;  Location: [facility];  Service: Gynecology;  Laterality: N/A;   IUD REMOVAL N/A 01/25/2015   Procedure: INTRAUTERINE DEVICE (IUD) REMOVAL;  Surgeon: Shelly Bombard, MD;  Location: [facility];  Service: Gynecology;  Laterality: N/A;    Family History  Problem Relation Age of Onset   Hypertension Father    Alcohol abuse Father    Cirrhosis Father    Stroke Maternal Grandmother    Arthritis Mother    Hyperlipidemia Mother    Heart disease Maternal Uncle 31    Social History   Socioeconomic History   Marital status: Single    Spouse name: Not on file   Number of children: Not on file   Years of education: Not on file   Highest education level: Not on file  Occupational History   Not on file  Tobacco Use   Smoking status: Never   Smokeless tobacco: Never  Substance and Sexual Activity   Alcohol use: Yes    Alcohol/week: 1.0 - 2.0 standard drink    Types: 1 - 2 Glasses of wine per week    Comment: occasional   Drug use: No   Sexual activity: Yes    Partners: Male    Birth control/protection: Pill  Other Topics Concern    Not on file  Social History Narrative   Single, lives with her daughter   Daughter 2005    Works in [place] for [employer].    Completed college   No pets   Enjoys television   Social Determinants of Radio broadcast assistant Strain: Not on file  Food Insecurity: Not on file  Transportation Needs: Not on file  Physical Activity: Not on file  Stress: Not on file  Social Connections: Not on file  Intimate Partner Violence: Not on file    Outpatient Medications Prior to Visit  Medication Sig Dispense Refill   ibuprofen (ADVIL) 800 MG tablet Take 1 tablet (800 mg total) by mouth every 8 (eight) hours as needed. 30 tablet 5   norethindrone-ethinyl estradiol 1/35 (NORTREL 1/35, 28,) tablet Take 1 tablet by mouth daily. 84 tablet 3   amLODipine (NORVASC) 10 MG tablet TAKE 1 TABLET(10 MG) BY MOUTH DAILY 90 tablet 1   spironolactone (ALDACTONE) 25 MG tablet TAKE 1 TABLET(25 MG) BY MOUTH DAILY 90 tablet 0   No facility-administered medications prior to visit.    No Known Allergies  ROS See HPI    Objective:    Physical Exam Constitutional:  General: She is not in acute distress.    Appearance: Normal appearance. She is well-developed.  HENT:     Head: Normocephalic and atraumatic.     Right Ear: External ear normal.     Left Ear: External ear normal.  Eyes:     General: No scleral icterus. Neck:     Thyroid: No thyromegaly.  Cardiovascular:     Rate and Rhythm: Normal rate and regular rhythm.     Heart sounds: Normal heart sounds. No murmur heard. Pulmonary:     Effort: Pulmonary effort is normal. No respiratory distress.     Breath sounds: Normal breath sounds. No wheezing.  Musculoskeletal:     Cervical back: Neck supple.  Skin:    General: Skin is warm and dry.  Neurological:     Mental Status: She is alert and oriented to person, place, and time.  Psychiatric:        Mood and Affect: Mood normal.        Behavior: Behavior normal.        Thought Content:  Thought content normal.        Judgment: Judgment normal.    BP 132/83 (BP Location: Right Arm, Patient Position: Sitting, Cuff Size: Small)    Pulse 100    Temp 99.5 F (37.5 C) (Oral)    Resp 16    Ht 5\' 4"  (1.626 m)    Wt 191 lb (86.6 kg)    SpO2 99%    BMI 32.79 kg/m  Wt Readings from Last 3 Encounters:  05/08/21 191 lb (86.6 kg)  11/14/20 190 lb (86.2 kg)  10/27/20 188 lb (85.3 kg)       Assessment & Plan:   Problem List Items Addressed This Visit       Unprioritized   HTN (hypertension) - Primary    BP is at goal. Continue current dose of amlodipine/aldactone.       Relevant Medications   amLODipine (NORVASC) 10 MG tablet   spironolactone (ALDACTONE) 25 MG tablet   Other Relevant Orders   Basic metabolic panel   Other Visit Diagnoses     Colon cancer screening       Relevant Orders   Ambulatory referral to Gastroenterology   Needs flu shot       Relevant Orders   Flu Vaccine QUAD 6+ mos PF IM (Fluarix Quad PF) (Completed)       I am having [name] maintain her ibuprofen, Nortrel 1/35 (28), amLODipine, and spironolactone.  Meds ordered this encounter  Medications   amLODipine (NORVASC) 10 MG tablet    Sig: TAKE 1 TABLET(10 MG) BY MOUTH DAILY    Dispense:  90 tablet    Refill:  1    Order Specific Question:   Supervising Provider    Answer:   Penni Homans A [[id number]]   spironolactone (ALDACTONE) 25 MG tablet    Sig: TAKE 1 TABLET(25 MG) BY MOUTH DAILY    Dispense:  90 tablet    Refill:  1    Order Specific Question:   Supervising Provider    Answer:   Penni Homans A [[id number]]

## 2021-05-09 LAB — BASIC METABOLIC PANEL
BUN: 7 mg/dL (ref 6–23)
CO2: 25 mEq/L (ref 19–32)
Calcium: 9.3 mg/dL (ref 8.4–10.5)
Chloride: 103 mEq/L (ref 96–112)
Creatinine, Ser: 0.64 mg/dL (ref 0.40–1.20)
GFR: 104.26 mL/min (ref >60.00)
Glucose, Bld: 98 mg/dL (ref 70–99)
Potassium: 3.6 mEq/L (ref 3.5–5.1)
Sodium: 139 mEq/L (ref 135–145)

## 2021-05-09 NOTE — Assessment & Plan Note (Signed)
BP is at goal. Continue current dose of amlodipine/aldactone.

## 2021-05-19 DIAGNOSIS — Z20822 Contact with and (suspected) exposure to covid-19: Secondary | ICD-10-CM | POA: Diagnosis not present

## 2021-05-19 DIAGNOSIS — Z03818 Encounter for observation for suspected exposure to other biological agents ruled out: Secondary | ICD-10-CM | POA: Diagnosis not present

## 2021-06-08 DIAGNOSIS — Z03818 Encounter for observation for suspected exposure to other biological agents ruled out: Secondary | ICD-10-CM | POA: Diagnosis not present

## 2021-06-08 DIAGNOSIS — Z20822 Contact with and (suspected) exposure to covid-19: Secondary | ICD-10-CM | POA: Diagnosis not present

## 2021-06-29 ENCOUNTER — Encounter: Payer: Federal, State, Local not specified - PPO | Admitting: Gastroenterology

## 2021-07-07 DIAGNOSIS — Z03818 Encounter for observation for suspected exposure to other biological agents ruled out: Secondary | ICD-10-CM | POA: Diagnosis not present

## 2021-07-07 DIAGNOSIS — Z20822 Contact with and (suspected) exposure to covid-19: Secondary | ICD-10-CM | POA: Diagnosis not present

## 2021-08-25 DIAGNOSIS — Z03818 Encounter for observation for suspected exposure to other biological agents ruled out: Secondary | ICD-10-CM | POA: Diagnosis not present

## 2021-08-25 DIAGNOSIS — J00 Acute nasopharyngitis [common cold]: Secondary | ICD-10-CM | POA: Diagnosis not present

## 2021-08-25 DIAGNOSIS — Z20822 Contact with and (suspected) exposure to covid-19: Secondary | ICD-10-CM | POA: Diagnosis not present

## 2021-08-25 DIAGNOSIS — I10 Essential (primary) hypertension: Secondary | ICD-10-CM | POA: Diagnosis not present

## 2021-11-05 ENCOUNTER — Ambulatory Visit: Payer: Federal, State, Local not specified - PPO | Admitting: Family

## 2021-11-10 ENCOUNTER — Other Ambulatory Visit: Payer: Self-pay | Admitting: Family

## 2021-11-25 ENCOUNTER — Other Ambulatory Visit: Payer: Self-pay | Admitting: Obstetrics

## 2021-11-25 DIAGNOSIS — Z3041 Encounter for surveillance of contraceptive pills: Secondary | ICD-10-CM

## 2021-12-07 DIAGNOSIS — S80861A Insect bite (nonvenomous), right lower leg, initial encounter: Secondary | ICD-10-CM | POA: Diagnosis not present

## 2021-12-07 DIAGNOSIS — W57XXXA Bitten or stung by nonvenomous insect and other nonvenomous arthropods, initial encounter: Secondary | ICD-10-CM | POA: Diagnosis not present

## 2021-12-07 DIAGNOSIS — Z6833 Body mass index (BMI) 33.0-33.9, adult: Secondary | ICD-10-CM | POA: Diagnosis not present

## 2021-12-07 DIAGNOSIS — S80862A Insect bite (nonvenomous), left lower leg, initial encounter: Secondary | ICD-10-CM | POA: Diagnosis not present

## 2021-12-11 ENCOUNTER — Ambulatory Visit: Payer: Federal, State, Local not specified - PPO | Admitting: Family

## 2021-12-11 VITALS — BP 128/86 | HR 115 | Temp 99.4°F | Resp 16 | Wt 191.0 lb

## 2021-12-11 DIAGNOSIS — W57XXXA Bitten or stung by nonvenomous insect and other nonvenomous arthropods, initial encounter: Secondary | ICD-10-CM

## 2021-12-11 DIAGNOSIS — S80869A Insect bite (nonvenomous), unspecified lower leg, initial encounter: Secondary | ICD-10-CM | POA: Insufficient documentation

## 2021-12-11 DIAGNOSIS — Z1211 Encounter for screening for malignant neoplasm of colon: Secondary | ICD-10-CM | POA: Diagnosis not present

## 2021-12-11 DIAGNOSIS — S80861A Insect bite (nonvenomous), right lower leg, initial encounter: Secondary | ICD-10-CM | POA: Diagnosis not present

## 2021-12-11 DIAGNOSIS — I10 Essential (primary) hypertension: Secondary | ICD-10-CM | POA: Diagnosis not present

## 2021-12-11 DIAGNOSIS — H35033 Hypertensive retinopathy, bilateral: Secondary | ICD-10-CM | POA: Diagnosis not present

## 2021-12-11 DIAGNOSIS — Z1231 Encounter for screening mammogram for malignant neoplasm of breast: Secondary | ICD-10-CM

## 2021-12-11 DIAGNOSIS — S80862A Insect bite (nonvenomous), left lower leg, initial encounter: Secondary | ICD-10-CM

## 2021-12-11 NOTE — Assessment & Plan Note (Signed)
Resolving. I suspect she got these bites while outside.  Can continue topical hydrocortisone cream prn itching. She takes claritin daily. Can add in benadryl in the evenings if needed. Advised her OK to return to work.

## 2021-12-11 NOTE — Progress Notes (Signed)
Subjective:   By signing my name below, I, Carylon Perches, attest that this documentation has been prepared under the direction and in the presence of [name], NP 12/11/2021   Patient ID: Victoria Mejia, female    DOB: 1971/03/02, 51 y.o.   MRN: [id number]  Chief Complaint  Patient presents with   Insect Bite    Patient complains of bug bites on legs    HPI Patient is in today for an office visit   Insect Bites: She complains of insect bites on her lower extremities. She notes that the areas are improving. Itching begun on 12/10/2021. She uses hydrocortisone for the area and states that the cream is helpful as her itching is improving. She states that she was outside on Tuesday and Wednesdays for a few minutes underneath trees. She did not notice any biting during that time.  Blood Pressure: As of today's visit, her blood pressure is normal BP Readings from Last 3 Encounters:  12/11/21 128/86  05/08/21 132/83  11/14/20 118/82   Pulse Readings from Last 3 Encounters:  12/11/21 (!) 115  05/08/21 100  11/14/20 88   Mammogram: Last completed on 10/27/2020  Colonoscopy: She was scheduled for a colonoscopy but cancelled the appointment because she does not have a designated driver. She has not rescheduled an appointment yet. She denies of any family history of colon cancer. She is interested in using a Cologuard.   Health Maintenance Due  Topic Date Due   COLONOSCOPY (Pts 45-10yr Insurance coverage will need to be confirmed)  Never done   COVID-19 Vaccine (3 - Moderna series) 08/06/2020   MAMMOGRAM  10/27/2021   INFLUENZA VACCINE  12/04/2021    Past Medical History:  Diagnosis Date   History of chicken pox    Hyperlipidemia    Hypertension     Past Surgical History:  Procedure Laterality Date   CESAREAN SECTION  2005   HYSTEROSCOPY N/A 01/25/2015   Procedure: HYSTEROSCOPY;  Surgeon: CShelly Bombard MD;  [facility];  Service: Gynecology;   Laterality: N/A;   IUD REMOVAL N/A 01/25/2015   Procedure: INTRAUTERINE DEVICE (IUD) REMOVAL;  Surgeon: CShelly Bombard MD;  [facility];  Service: Gynecology;  Laterality: N/A;    Family History  Problem Relation Age of Onset   Hypertension Father    Alcohol abuse Father    Cirrhosis Father    Stroke Maternal Grandmother    Arthritis Mother    Hyperlipidemia Mother    Heart disease Maternal Uncle 543   Social History   Socioeconomic History   Marital status: Single    Spouse name: Not on file   Number of children: Not on file   Years of education: Not on file   Highest education level: Not on file  Occupational History   Not on file  Tobacco Use   Smoking status: Never   Smokeless tobacco: Never  Substance and Sexual Activity   Alcohol use: Yes    Alcohol/week: 1.0 - 2.0 standard drink of alcohol    Types: 1 - 2 Glasses of wine per week    Comment: occasional   Drug use: No   Sexual activity: Yes    Partners: Male    Birth control/protection: Pill  Other Topics Concern   Not on file  Social History Narrative   Single, lives with her daughter   Daughter 2005    [place] [employer]    Completed college  No pets   Enjoys television   Social Determinants of Radio broadcast assistant Strain: Not on file  Food Insecurity: Not on file  Transportation Needs: Not on file  Physical Activity: Not on file  Stress: Not on file  Social Connections: Not on file  Intimate Partner Violence: Not on file    Outpatient Medications Prior to Visit  Medication Sig Dispense Refill   ALAYCEN 1/35 tablet TAKE 1 TABLET BY MOUTH EVERY DAY 84 tablet 3   amLODipine (NORVASC) 10 MG tablet TAKE 1 TABLET BY MOUTH EVERY DAY 30 tablet 0   ibuprofen (ADVIL) 800 MG tablet Take 1 tablet (800 mg total) by mouth every 8 (eight) hours as needed. 30 tablet 5   loratadine (CLARITIN) 10 MG tablet Take by mouth.     spironolactone (ALDACTONE) 25 MG tablet TAKE 1 TABLET BY  MOUTH DAILY 30 tablet 0   No facility-administered medications prior to visit.    No Known Allergies  Review of Systems  Skin:        (+) Insect Bites (LE)       Objective:    Physical Exam Constitutional:      General: She is not in acute distress.    Appearance: Normal appearance. She is not ill-appearing.  HENT:     Head: Normocephalic and atraumatic.     Right Ear: External ear normal.     Left Ear: External ear normal.  Eyes:     Extraocular Movements: Extraocular movements intact.     Pupils: Pupils are equal, round, and reactive to light.  Cardiovascular:     Rate and Rhythm: Normal rate and regular rhythm.     Heart sounds: Normal heart sounds. No murmur heard.    No gallop.  Pulmonary:     Effort: Pulmonary effort is normal. No respiratory distress.     Breath sounds: Normal breath sounds. No wheezing or rales.  Skin:    General: Skin is warm and dry.     Comments: Near resolution of insect bites noted bilateral legs  Neurological:     Mental Status: She is alert and oriented to person, place, and time.  Psychiatric:        Mood and Affect: Mood normal.        Behavior: Behavior normal.        Judgment: Judgment normal.      BP 128/86 (BP Location: Right Arm, Patient Position: Sitting, Cuff Size: Small)   Pulse (!) 115   Temp 99.4 F (37.4 C) (Oral)   Resp 16   Wt 191 lb (86.6 kg)   SpO2 99%   BMI 32.79 kg/m  Wt Readings from Last 3 Encounters:  12/11/21 191 lb (86.6 kg)  05/08/21 191 lb (86.6 kg)  11/14/20 190 lb (86.2 kg)       Assessment & Plan:   Problem List Items Addressed This Visit       Unprioritized   Insect bite of lower leg    Resolving. I suspect she got these bites while outside.  Can continue topical hydrocortisone cream prn itching. She takes claritin daily. Can add in benadryl in the evenings if needed. Advised her OK to return to work.       HTN (hypertension) - Primary    BP Readings from Last 3 Encounters:   12/11/21 128/86  05/08/21 132/83  11/14/20 118/82  BP looks good. Continue aldactone and amlodipine.       Relevant Orders   Basic  metabolic panel   Other Visit Diagnoses     Colon cancer screening       Relevant Orders   Cologuard   Encounter for screening mammogram for malignant neoplasm of breast       Relevant Orders   MM 3D SCREEN BREAST BILATERAL      No orders of the defined types were placed in this encounter.   I, [name], NP, personally preformed the services described in this documentation.  All medical record entries made by the scribe were at my direction and in my presence.  I have reviewed the chart and discharge instructions (if applicable) and agree that the record reflects my personal performance and is accurate and complete. 12/11/2021   I,Amber Collins,acting as a scribe for [name], NP.,have documented all relevant documentation on the behalf of [name], NP,as directed by  [name], NP while in the presence of [name], NP.    [name], NP

## 2021-12-11 NOTE — Assessment & Plan Note (Signed)
BP Readings from Last 3 Encounters:  12/11/21 128/86  05/08/21 132/83  11/14/20 118/82   BP looks good. Continue aldactone and amlodipine.

## 2021-12-12 ENCOUNTER — Other Ambulatory Visit: Payer: Self-pay | Admitting: Family

## 2021-12-12 LAB — BASIC METABOLIC PANEL
BUN: 8 mg/dL (ref 6–23)
CO2: 26 mEq/L (ref 19–32)
Calcium: 9.7 mg/dL (ref 8.4–10.5)
Chloride: 104 mEq/L (ref 96–112)
Creatinine, Ser: 0.67 mg/dL (ref 0.40–1.20)
GFR: 102.69 mL/min (ref >60.00)
Glucose, Bld: 161 mg/dL — ABNORMAL HIGH (ref 70–99)
Potassium: 4.2 mEq/L (ref 3.5–5.1)
Sodium: 140 mEq/L (ref 135–145)

## 2021-12-16 ENCOUNTER — Telehealth: Payer: Self-pay | Admitting: Family

## 2021-12-16 DIAGNOSIS — R739 Hyperglycemia, unspecified: Secondary | ICD-10-CM

## 2021-12-16 NOTE — Telephone Encounter (Signed)
Please advise pt that I reviewed her lab work and her sugar is elevated. We haven't checked an A1C in a while. Let's bring her back at her convenience for an A1C please, dx hyperglycemia.

## 2021-12-18 NOTE — Telephone Encounter (Signed)
Patient advised or results and provider's advise. She was scheduled to come in 8/17 for labs

## 2021-12-20 ENCOUNTER — Other Ambulatory Visit (INDEPENDENT_AMBULATORY_CARE_PROVIDER_SITE_OTHER): Payer: Federal, State, Local not specified - PPO

## 2021-12-20 DIAGNOSIS — R739 Hyperglycemia, unspecified: Secondary | ICD-10-CM

## 2021-12-20 LAB — HEMOGLOBIN A1C: Hgb A1c MFr Bld: 6.6 % — ABNORMAL HIGH (ref 4.6–6.5)

## 2022-01-02 ENCOUNTER — Ambulatory Visit: Payer: Federal, State, Local not specified - PPO | Admitting: Family

## 2022-01-11 ENCOUNTER — Ambulatory Visit: Payer: Federal, State, Local not specified - PPO | Admitting: Family

## 2022-01-11 VITALS — BP 125/85 | HR 99 | Temp 98.7°F | Resp 16 | Wt 194.0 lb

## 2022-01-11 DIAGNOSIS — R519 Headache, unspecified: Secondary | ICD-10-CM | POA: Diagnosis not present

## 2022-01-11 DIAGNOSIS — Z23 Encounter for immunization: Secondary | ICD-10-CM | POA: Diagnosis not present

## 2022-01-11 DIAGNOSIS — I10 Essential (primary) hypertension: Secondary | ICD-10-CM

## 2022-01-11 NOTE — Progress Notes (Signed)
Subjective:   By signing my name below, I, Victoria Mejia, attest that this documentation has been prepared under the direction and in the presence of Victoria Chimera, NP 01/11/2022   Patient ID: Victoria Mejia, female    DOB: 03-31-1971, 51 y.o.   MRN: [id number]  Chief Complaint  Patient presents with   Headache    Patient complains of headaches for the past 45 days at least 2 to 3 times a week.    Stress    Patient complains of increased stress    HPI Patient is in today for an office visit  FMLA: She is requesting completion of her FMLA forms due to persistent headaches. She has not missed work due to her headaches.  Headaches: She complains of headaches that mostly occur during the mornings that occur about several times a week. She takes OTC cramp medications for her symptoms. Her headaches occur usually on the left side of her head that produces a dull ache. Bright lights used to bother her visions but she states that she has now gotten used to them. She denies of any nausea but she does have GI symptoms such as loose stools. She is interested in being referred to a neurologist for her symptoms.   Health Maintenance Due  Topic Date Due   COLONOSCOPY (Pts 45-32yr Insurance coverage will need to be confirmed)  Never done   COVID-19 Vaccine (3 - Moderna series) 08/06/2020   MAMMOGRAM  10/27/2021    Past Medical History:  Diagnosis Date   History of chicken pox    Hyperlipidemia    Hypertension     Past Surgical History:  Procedure Laterality Date   CESAREAN SECTION  2005   HYSTEROSCOPY N/A 01/25/2015   Procedure: HYSTEROSCOPY;  Surgeon: CShelly Bombard MD;  [facility];  Service: Gynecology;  Laterality: N/A;   IUD REMOVAL N/A 01/25/2015   Procedure: INTRAUTERINE DEVICE (IUD) REMOVAL;  Surgeon: CShelly Bombard MD;  [facility];  Service: Gynecology;  Laterality: N/A;    Family History  Problem Relation Age of Onset   Hypertension Father     Alcohol abuse Father    Cirrhosis Father    Stroke Maternal Grandmother    Arthritis Mother    Hyperlipidemia Mother    Heart disease Maternal Uncle 570   Social History   Socioeconomic History   Marital status: Single    Spouse name: Not on file   Number of children: Not on file   Years of education: Not on file   Highest education level: Not on file  Occupational History   Not on file  Tobacco Use   Smoking status: Never   Smokeless tobacco: Never  Substance and Sexual Activity   Alcohol use: Yes    Alcohol/week: 1.0 - 2.0 standard drink of alcohol    Types: 1 - 2 Glasses of wine per week    Comment: occasional   Drug use: No   Sexual activity: Yes    Partners: Male    Birth control/protection: Pill  Other Topics Concern   Not on file  Social History Narrative   Single, lives with her daughter   Daughter 2005    Works in [employer]    Completed college   No pets   Enjoys television   Social Determinants of Health   Financial Resource Strain: Not on file  Food Insecurity: Not on file  Transportation Needs: Not on file  Physical Activity:  Not on file  Stress: Not on file  Social Connections: Not on file  Intimate Partner Violence: Not on file    Outpatient Medications Prior to Visit  Medication Sig Dispense Refill   ALAYCEN 1/35 tablet TAKE 1 TABLET BY MOUTH EVERY DAY 84 tablet 3   amLODipine (NORVASC) 10 MG tablet TAKE 1 TABLET BY MOUTH EVERY DAY 90 tablet 2   ibuprofen (ADVIL) 800 MG tablet Take 1 tablet (800 mg total) by mouth every 8 (eight) hours as needed. 30 tablet 5   spironolactone (ALDACTONE) 25 MG tablet TAKE 1 TABLET BY MOUTH EVERY DAY 90 tablet 2   loratadine (CLARITIN) 10 MG tablet Take by mouth.     No facility-administered medications prior to visit.    No Known Allergies  Review of Systems  Gastrointestinal:  Positive for diarrhea. Negative for nausea.  Neurological:  Positive for headaches (Left Side).        Objective:    Physical Exam Constitutional:      General: She is not in acute distress.    Appearance: Normal appearance. She is not ill-appearing.  HENT:     Head: Normocephalic and atraumatic.     Right Ear: External ear normal.     Left Ear: External ear normal.  Eyes:     General: No scleral icterus.    Extraocular Movements: Extraocular movements intact.     Pupils: Pupils are equal, round, and reactive to light.  Neck:     Thyroid: No thyromegaly.  Cardiovascular:     Rate and Rhythm: Normal rate and regular rhythm.     Heart sounds: Normal heart sounds. No murmur heard.    No gallop.  Pulmonary:     Effort: Pulmonary effort is normal. No respiratory distress.     Breath sounds: Normal breath sounds. No wheezing or rales.  Musculoskeletal:     Comments: 5/5 strength in upper extremities     Lymphadenopathy:     Cervical: No cervical adenopathy.  Skin:    General: Skin is warm and dry.  Neurological:     Mental Status: She is alert and oriented to person, place, and time.  Psychiatric:        Mood and Affect: Mood normal.        Behavior: Behavior normal.        Judgment: Judgment normal.     BP 125/85 (BP Location: Right Arm, Patient Position: Sitting, Cuff Size: Small)   Pulse 99   Temp 98.7 F (37.1 C) (Oral)   Resp 16   Wt 194 lb (88 kg)   SpO2 99%   BMI 33.30 kg/m  Wt Readings from Last 3 Encounters:  01/11/22 194 lb (88 kg)  12/11/21 191 lb (86.6 kg)  05/08/21 191 lb (86.6 kg)       Assessment & Plan:   Problem List Items Addressed This Visit       Unprioritized   HTN (hypertension)    BP Readings from Last 3 Encounters:  01/11/22 125/85  12/11/21 128/86  05/08/21 132/83  BP stable. Continue amlodipine and aldactone.       Frequent headaches - Primary    Uncontrolled. FMLA form filled for patient today. Recommended referral to neurology for further evaluation.       Relevant Orders   Ambulatory referral to Neurology   Other  Visit Diagnoses     Needs flu shot       Relevant Orders   Flu Vaccine QUAD 6+ mos PF  IM (Fluarix Quad PF) (Completed)      No orders of the defined types were placed in this encounter.   I, [name], NP, personally preformed the services described in this documentation.  All medical record entries made by the scribe were at my direction and in my presence.  I have reviewed the chart and discharge instructions (if applicable) and agree that the record reflects my personal performance and is accurate and complete. 01/11/2022   I,Amber Collins,acting as a scribe for [name], NP.,have documented all relevant documentation on the behalf of [name], NP,as directed by  [name], NP while in the presence of [name], NP.    [name], NP

## 2022-01-11 NOTE — Assessment & Plan Note (Signed)
BP Readings from Last 3 Encounters:  01/11/22 125/85  12/11/21 128/86  05/08/21 132/83   BP stable. Continue amlodipine and aldactone.

## 2022-01-11 NOTE — Assessment & Plan Note (Addendum)
Uncontrolled. FMLA form filled for patient today. Recommended referral to neurology for further evaluation.

## 2022-01-14 ENCOUNTER — Inpatient Hospital Stay (HOSPITAL_BASED_OUTPATIENT_CLINIC_OR_DEPARTMENT_OTHER): Admission: RE | Admit: 2022-01-14 | Payer: Federal, State, Local not specified - PPO | Source: Ambulatory Visit

## 2022-01-15 ENCOUNTER — Telehealth (HOSPITAL_BASED_OUTPATIENT_CLINIC_OR_DEPARTMENT_OTHER): Payer: Self-pay

## 2022-02-12 ENCOUNTER — Telehealth: Payer: Self-pay | Admitting: Family

## 2022-02-12 NOTE — Telephone Encounter (Signed)
Pt dropped off document to be filled out by provider, pt stated document was already filled out by provider but one area was not filled out by provider (On page 3 the yellow highlighted area is needing to be filled out #4) Pt would like to be called when document is completed at [facility] [phone]. Document put at front office tray under providers name.

## 2022-02-13 ENCOUNTER — Other Ambulatory Visit: Payer: Self-pay | Admitting: Family

## 2022-02-13 DIAGNOSIS — Z1231 Encounter for screening mammogram for malignant neoplasm of breast: Secondary | ICD-10-CM

## 2022-02-13 NOTE — Telephone Encounter (Signed)
Correction patient did not bring handicap form

## 2022-02-15 NOTE — Telephone Encounter (Signed)
Lvm for patient to be aware we take 5-7 days to complete forms and she will be notified when ready

## 2022-02-15 NOTE — Telephone Encounter (Signed)
Pt called wanting an update on paperwork.

## 2022-02-18 NOTE — Telephone Encounter (Signed)
Provider has patient's forms and will complete this week

## 2022-02-18 NOTE — Telephone Encounter (Signed)
Pt dropped off copy of the paperwork thinking that would be the documents that is need, pt wanted to make sure if she can get her paperwork done by tomorrow since pt already had brought in documents to be filled out and one section was not completed by provider - pt was needing document ASAP. Copy of paperwork was given to PCP. Please advise. Pt tel [phone].

## 2022-02-20 LAB — COLOGUARD: COLOGUARD: NEGATIVE

## 2022-02-20 NOTE — Telephone Encounter (Signed)
Patient will stop by today to pick it up.

## 2022-03-13 ENCOUNTER — Other Ambulatory Visit (HOSPITAL_BASED_OUTPATIENT_CLINIC_OR_DEPARTMENT_OTHER): Payer: Self-pay | Admitting: Family

## 2022-03-13 DIAGNOSIS — Z1231 Encounter for screening mammogram for malignant neoplasm of breast: Secondary | ICD-10-CM

## 2022-03-15 ENCOUNTER — Encounter (HOSPITAL_BASED_OUTPATIENT_CLINIC_OR_DEPARTMENT_OTHER): Payer: Self-pay | Admitting: Radiology

## 2022-03-15 ENCOUNTER — Ambulatory Visit (HOSPITAL_BASED_OUTPATIENT_CLINIC_OR_DEPARTMENT_OTHER)
Admission: RE | Admit: 2022-03-15 | Discharge: 2022-03-15 | Disposition: A | Discharge status: Home / Self Care | Payer: Federal, State, Local not specified - PPO | Source: Ambulatory Visit | Attending: Family | Admitting: Family

## 2022-03-15 DIAGNOSIS — Z1231 Encounter for screening mammogram for malignant neoplasm of breast: Secondary | ICD-10-CM | POA: Insufficient documentation

## 2022-06-14 ENCOUNTER — Encounter: Payer: Federal, State, Local not specified - PPO | Admitting: Family

## 2022-08-19 ENCOUNTER — Encounter: Payer: Self-pay | Admitting: *Deleted

## 2022-08-20 IMAGING — MG MM DIGITAL SCREENING BILAT W/ TOMO AND CAD
8 series · 8 of 24 positions shown · non-contrast
Comparison: Previous exam(s).

CLINICAL DATA: Screening.

EXAM:
DIGITAL SCREENING BILATERAL MAMMOGRAM WITH TOMOSYNTHESIS AND CAD
TECHNIQUE: Bilateral screening digital craniocaudal and mediolateral oblique
mammograms were obtained. Bilateral screening digital breast
tomosynthesis was performed. The images were evaluated with
computer-aided detection.

[L CC synth-2D]
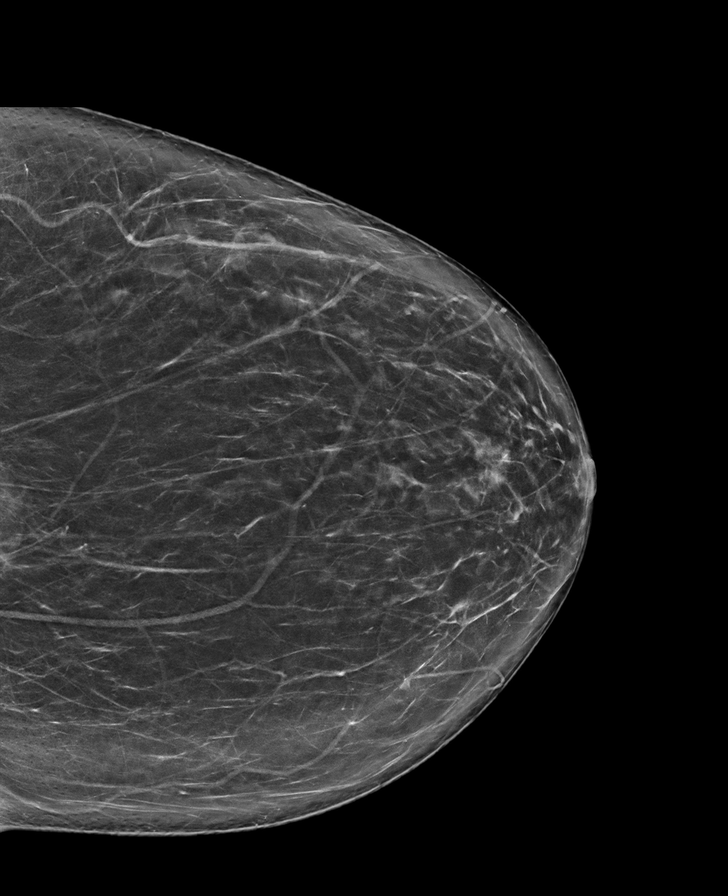

[R CC synth-2D]
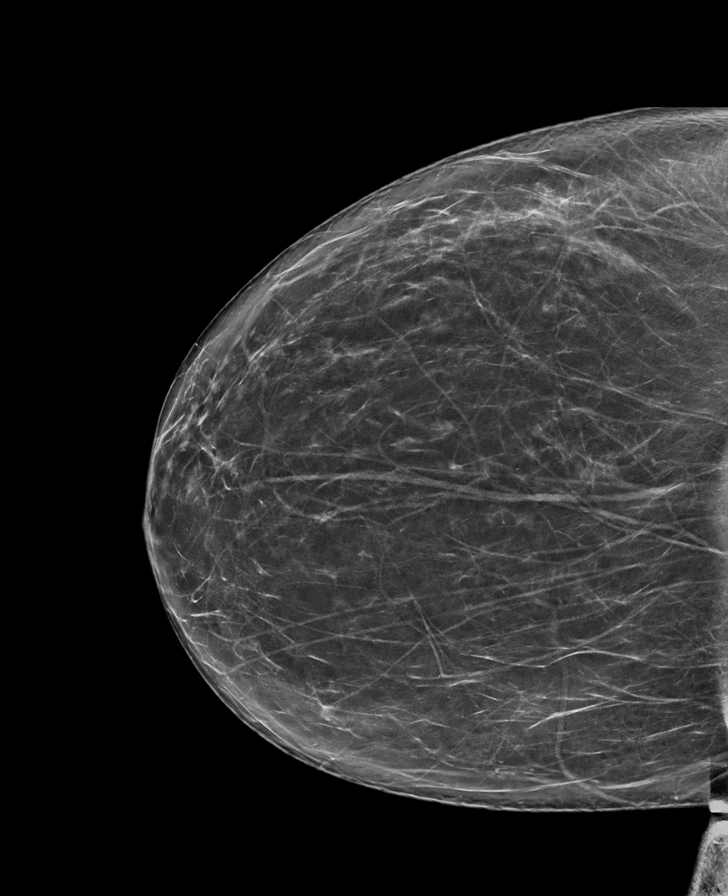

[L MLO synth-2D]
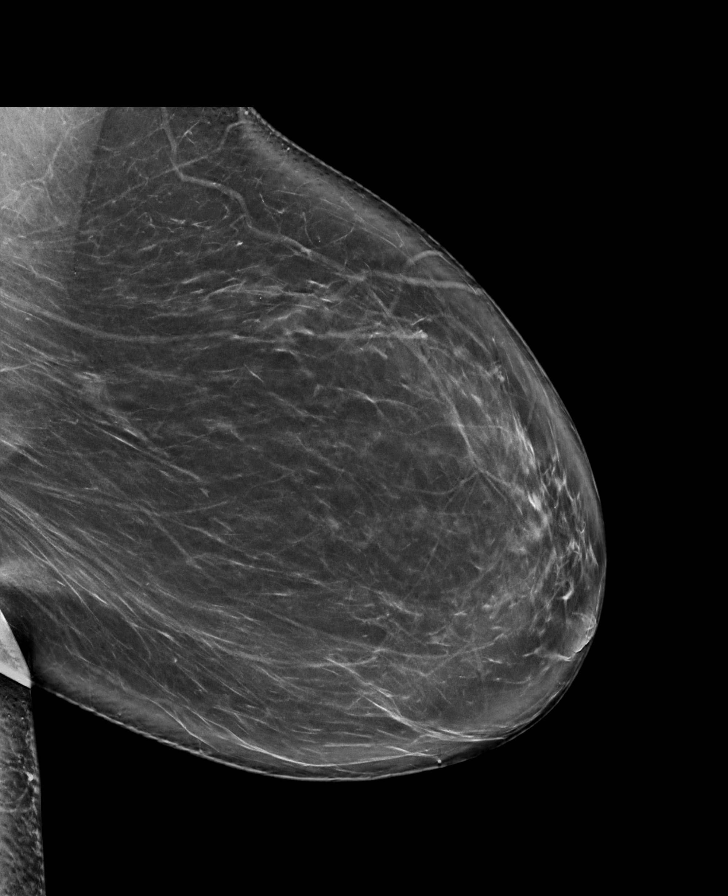

[R MLO synth-2D]
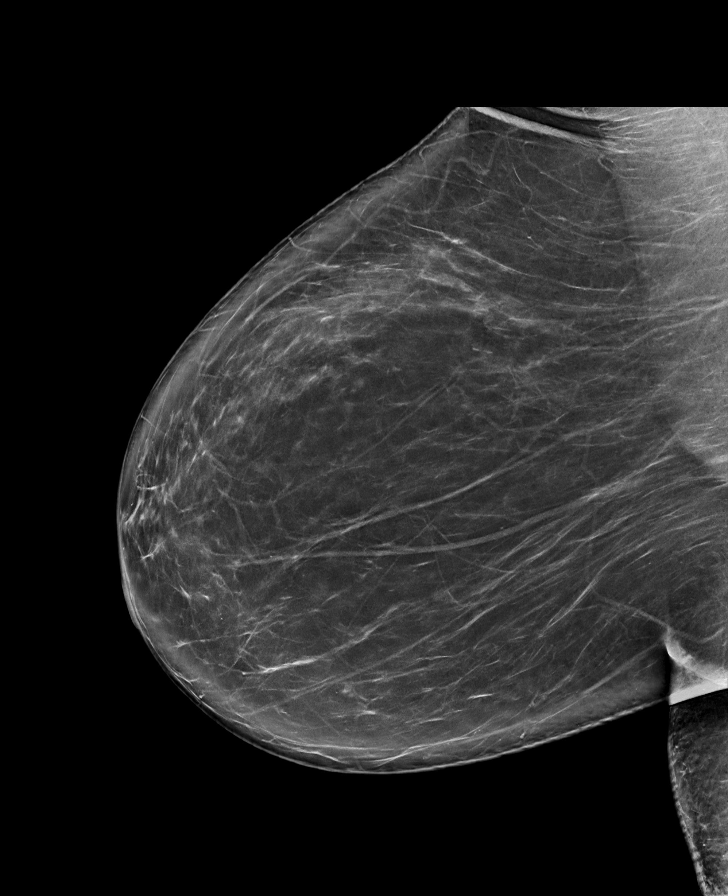

[R CC tomo · tomo slice 41/81.0]
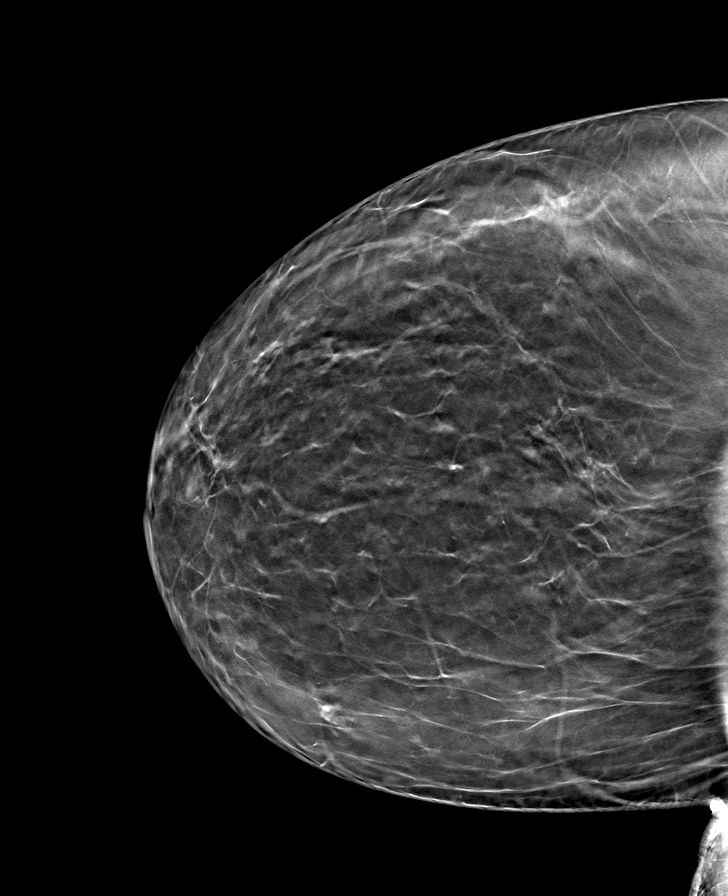

[L MLO tomo · tomo slice 49/96.0]
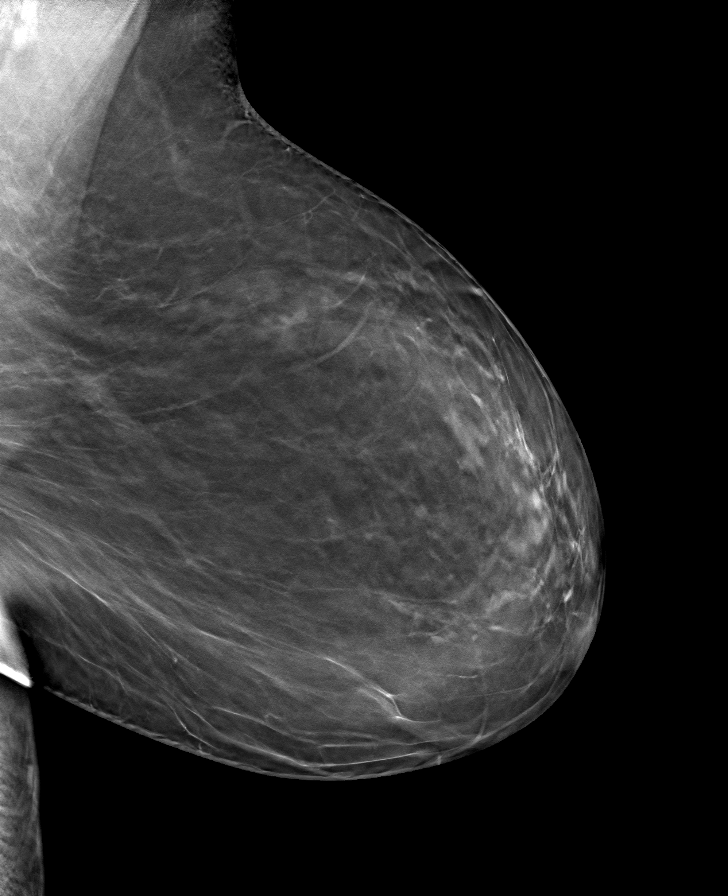

[L CC tomo · tomo slice 43/86.0]
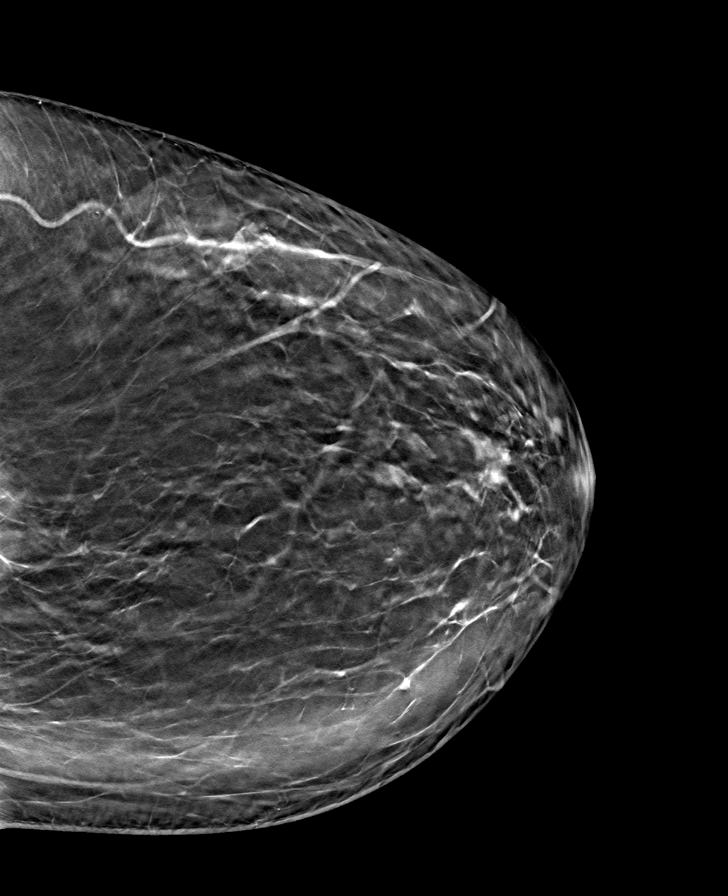

[R MLO tomo · tomo slice 47/93.0]
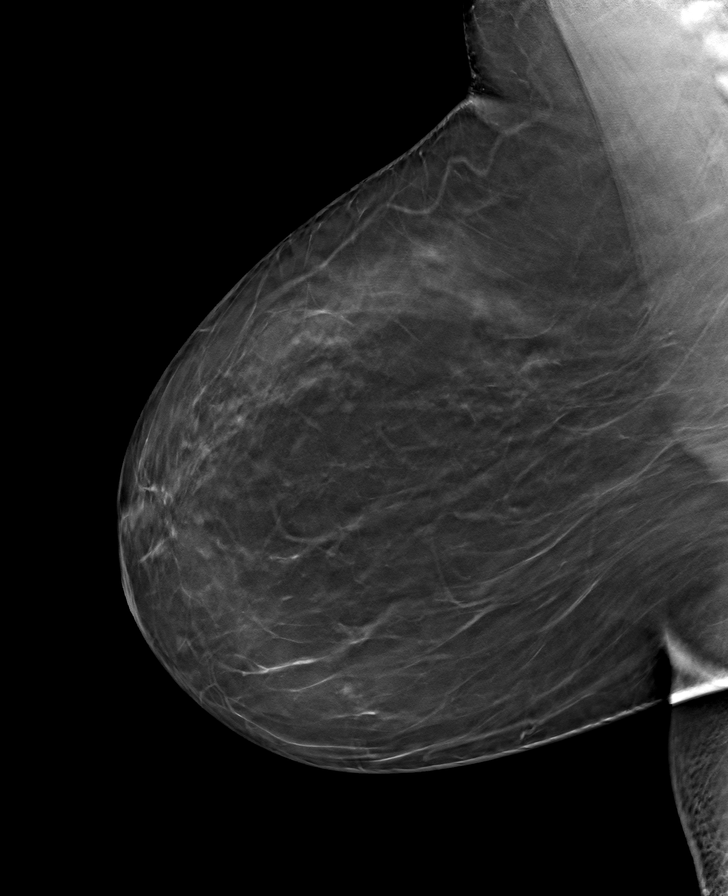

[8 of 24 positions shown; findings below may reference images not displayed]

ACR Breast Density Category b: There are scattered areas of
fibroglandular density.
FINDINGS: There are no findings suspicious for malignancy.
IMPRESSION: No mammographic evidence of malignancy. A result letter of this
screening mammogram will be mailed directly to the patient.

RECOMMENDATION:
Screening mammogram in one year. (Code:51-O-LD2)

BI-RADS CATEGORY  1: Negative.

## 2022-09-09 ENCOUNTER — Other Ambulatory Visit: Payer: Self-pay | Admitting: Family

## 2022-09-09 NOTE — Telephone Encounter (Signed)
Please contact pt to schedule a follow up appointment.  °

## 2022-09-10 NOTE — Telephone Encounter (Signed)
Lvm2 sched  

## 2022-09-18 ENCOUNTER — Other Ambulatory Visit: Payer: Self-pay | Admitting: Family

## 2022-10-01 ENCOUNTER — Other Ambulatory Visit: Payer: Self-pay | Admitting: Family

## 2022-10-05 ENCOUNTER — Other Ambulatory Visit: Payer: Self-pay | Admitting: Family

## 2022-10-05 NOTE — Telephone Encounter (Signed)
Please contact pt to schedule a follow up visit.  

## 2022-10-09 NOTE — Telephone Encounter (Signed)
Called  but no answer, lvm for patient to call and scheduled an appointment to be seen in the next few weeks

## 2022-10-26 ENCOUNTER — Other Ambulatory Visit: Payer: Self-pay | Admitting: Obstetrics

## 2022-10-26 DIAGNOSIS — Z3041 Encounter for surveillance of contraceptive pills: Secondary | ICD-10-CM

## 2022-10-31 ENCOUNTER — Encounter: Payer: Self-pay | Admitting: *Deleted

## 2022-10-31 ENCOUNTER — Other Ambulatory Visit: Payer: Self-pay | Admitting: Family

## 2022-11-30 ENCOUNTER — Other Ambulatory Visit: Payer: Self-pay | Admitting: Family

## 2022-12-17 ENCOUNTER — Ambulatory Visit: Payer: Federal, State, Local not specified - PPO | Admitting: Family

## 2022-12-17 VITALS — BP 126/73 | HR 106 | Temp 98.4°F | Resp 16 | Wt 191.0 lb

## 2022-12-17 DIAGNOSIS — E119 Type 2 diabetes mellitus without complications: Secondary | ICD-10-CM | POA: Diagnosis not present

## 2022-12-17 DIAGNOSIS — I10 Essential (primary) hypertension: Secondary | ICD-10-CM | POA: Diagnosis not present

## 2022-12-17 DIAGNOSIS — E785 Hyperlipidemia, unspecified: Secondary | ICD-10-CM

## 2022-12-17 NOTE — Progress Notes (Unsigned)
Subjective:     Patient ID: Victoria Mejia, female    DOB: 1970/06/30, 52 y.o.   MRN: [id number]  Chief Complaint  Patient presents with   Hypertension    Here for follo wup    HPI  Discussed the use of AI scribe software for clinical note transcription with the patient, who gave verbal consent to proceed.  History of Present Illness   Victoria Mejia, a patient with a history of hypertension, presents for a routine follow-up visit. She is currently on amlodipine 10mg  and Aldactone 25mg  for blood pressure management. The patient's blood pressure at the visit is 126/70, indicating good control of her hypertension with the current regimen.  In addition to hypertension, the patient has been noted to have elevated blood sugar levels. Her last A1c was 6.6, which is in the diabetes range. The patient acknowledges the need for a healthier diet and increased physical activity to manage her blood sugar levels.          Health Maintenance Due  Topic Date Due   FOOT EXAM  Never done   OPHTHALMOLOGY EXAM  Never done   Diabetic kidney evaluation - Urine ACR  Never done   COVID-19 Vaccine (4 - 2023-24 season) 01/04/2022   Zoster Vaccines- Shingrix (1 of 2) Never done   INFLUENZA VACCINE  12/05/2022    Past Medical History:  Diagnosis Date   History of chicken pox    Hyperlipidemia    Hypertension     Past Surgical History:  Procedure Laterality Date   CESAREAN SECTION  2005   HYSTEROSCOPY N/A 01/25/2015   Procedure: HYSTEROSCOPY;  Surgeon: Brock Bad, MD;  Location: [facility];  Service: Gynecology;  Laterality: N/A;   IUD REMOVAL N/A 01/25/2015   Procedure: INTRAUTERINE DEVICE (IUD) REMOVAL;  Surgeon: Brock Bad, MD;  Location: [facility];  Service: Gynecology;  Laterality: N/A;    Family History  Problem Relation Age of Onset   Hypertension Father    Alcohol abuse Father    Cirrhosis Father    Stroke Maternal Grandmother    Arthritis Mother    Hyperlipidemia Mother     Heart disease Maternal Uncle 75    Social History   Socioeconomic History   Marital status: Single    Spouse name: Not on file   Number of children: Not on file   Years of education: Not on file   Highest education level: Bachelor's degree (e.g., BA, AB, BS)  Occupational History   Not on file  Tobacco Use   Smoking status: Never   Smokeless tobacco: Never  Substance and Sexual Activity   Alcohol use: Yes    Alcohol/week: 1.0 - 2.0 standard drink of alcohol    Types: 1 - 2 Glasses of wine per week    Comment: occasional   Drug use: No   Sexual activity: Yes    Partners: Male    Birth control/protection: Pill  Other Topics Concern   Not on file  Social History Narrative   Single, lives with her daughter   Daughter 2005    Works in [place] for [employer].    Completed college   No pets   Enjoys television   Social Determinants of Health   Financial Resource Strain: Low Risk  (12/16/2022)   Overall Financial Resource Strain (CARDIA)    Difficulty of Paying Living Expenses: Not hard at all  Food Insecurity: No Food Insecurity (12/16/2022)   Hunger Vital Sign    Worried  About Running Out of Food in the Last Year: Never true    Ran Out of Food in the Last Year: Never true  Transportation Needs: No Transportation Needs (12/16/2022)   PRAPARE - Administrator, Civil Service (Medical): No    Lack of Transportation (Non-Medical): No  Physical Activity: Unknown (12/16/2022)   Exercise Vital Sign    Days of Exercise per Week: 0 days    Minutes of Exercise per Session: Not on file  Stress: No Stress Concern Present (12/16/2022)   [employer] of Occupational Health - Occupational Stress Questionnaire    Feeling of Stress : Only a little  Social Connections: Moderately Isolated (12/16/2022)   Social Connection and Isolation Panel [NHANES]    Frequency of Communication with Friends and Family: More than three times a week    Frequency of Social Gatherings with  Friends and Family: Once a week    Attends Religious Services: More than 4 times per year    Active Member of [employer] or Organizations: No    Attends Engineer, structural: Not on file    Marital Status: Never married  Intimate Partner Violence: Not on file    Outpatient Medications Prior to Visit  Medication Sig Dispense Refill   ALAYCEN 1/35 tablet TAKE 1 TABLET BY MOUTH EVERY DAY 84 tablet 3   amLODipine (NORVASC) 10 MG tablet TAKE 1 TABLET BY MOUTH EVERY DAY 90 tablet 2   ibuprofen (ADVIL) 800 MG tablet Take 1 tablet (800 mg total) by mouth every 8 (eight) hours as needed. 30 tablet 5   loratadine (CLARITIN) 10 MG tablet Take by mouth.     spironolactone (ALDACTONE) 25 MG tablet TAKE 1 TABLET BY MOUTH EVERY DAY 90 tablet 1   No facility-administered medications prior to visit.    No Known Allergies  ROS See HPI    Objective:    Physical Exam Constitutional:      General: She is not in acute distress.    Appearance: Normal appearance. She is well-developed.  HENT:     Head: Normocephalic and atraumatic.     Right Ear: External ear normal.     Left Ear: External ear normal.  Eyes:     General: No scleral icterus. Neck:     Thyroid: No thyromegaly.  Cardiovascular:     Rate and Rhythm: Normal rate and regular rhythm.     Heart sounds: Normal heart sounds. No murmur heard. Pulmonary:     Effort: Pulmonary effort is normal. No respiratory distress.     Breath sounds: Normal breath sounds. No wheezing.  Musculoskeletal:     Cervical back: Neck supple.  Skin:    General: Skin is warm and dry.  Neurological:     Mental Status: She is alert and oriented to person, place, and time.  Psychiatric:        Mood and Affect: Mood normal.        Behavior: Behavior normal.        Thought Content: Thought content normal.        Judgment: Judgment normal.      BP 126/73 (BP Location: Right Arm, Patient Position: Sitting, Cuff Size: Large)   Pulse (!) 106   Temp 98.4  F (36.9 C) (Oral)   Resp 16   Wt 191 lb (86.6 kg)   SpO2 100%   BMI 32.79 kg/m  Wt Readings from Last 3 Encounters:  12/17/22 191 lb (86.6 kg)  01/11/22 194 lb (88  kg)  12/11/21 191 lb (86.6 kg)       Assessment & Plan:   Problem List Items Addressed This Visit       Unprioritized   Type 2 diabetes mellitus without complications (HCC)    Last A1C was 6.6, which is in the diabetes range. -Repeat A1C today. -Encourage healthy diet, reducing carbs and sweets, focusing on non-starchy vegetables and lean proteins. -Encourage physical activity. -Plan to follow up in 3-4 months to monitor labs.      Hyperlipidemia    Discussed risk of heart attack and stroke is higher with a diagnosis of diabetes, necessitating stricter cholesterol goals. -Check cholesterol today. - 10 CV risk 6.86, moderate risk.  - Would recommend statin next visit if no improvement.       HTN (hypertension)     Well controlled on Amlodipine 10mg  and Aldactone 25mg . Blood pressure today 126/70. -Continue current medications. -Check potassium due to potential for hyperkalemia with Aldactone.      Other Visit Diagnoses     Controlled type 2 diabetes mellitus without complication, without long-term current use of insulin (HCC)    -  Primary   Relevant Orders   HgB A1c (Completed)   Basic Metabolic Panel (BMET) (Completed)   Lipid panel (Completed)       I am having Irineo Axon. Aundria Rud maintain her ibuprofen, loratadine, amLODipine, ALAYCEN 1/35, and spironolactone.  No orders of the defined types were placed in this encounter.

## 2022-12-18 DIAGNOSIS — E119 Type 2 diabetes mellitus without complications: Secondary | ICD-10-CM | POA: Insufficient documentation

## 2022-12-18 DIAGNOSIS — E118 Type 2 diabetes mellitus with unspecified complications: Secondary | ICD-10-CM | POA: Insufficient documentation

## 2022-12-18 NOTE — Assessment & Plan Note (Signed)
  Well controlled on Amlodipine 10mg  and Aldactone 25mg . Blood pressure today 126/70. -Continue current medications. -Check potassium due to potential for hyperkalemia with Aldactone.

## 2022-12-18 NOTE — Assessment & Plan Note (Signed)
Discussed risk of heart attack and stroke is higher with a diagnosis of diabetes, necessitating stricter cholesterol goals. -Check cholesterol today. - 10 CV risk 6.86, moderate risk.  - Would recommend statin next visit if no improvement.

## 2022-12-18 NOTE — Assessment & Plan Note (Signed)
Last A1C was 6.6, which is in the diabetes range. -Repeat A1C today. -Encourage healthy diet, reducing carbs and sweets, focusing on non-starchy vegetables and lean proteins. -Encourage physical activity. -Plan to follow up in 3-4 months to monitor labs.

## 2022-12-18 NOTE — Patient Instructions (Signed)
VISIT SUMMARY:  Dear Victoria Mejia, thank you for coming in for your routine follow-up visit. 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 Your blood pressure is well controlled with your current medications. However, your blood sugar levels are in the diabetes range, and we discussed the importance of a healthier diet and increased physical activity. We also discussed the potential need for cholesterol medication due to your increased risk of heart attack and stroke with a diagnosis of diabetes.  YOUR PLAN:  -HYPERTENSION: Your blood pressure is well controlled with your current medications, Amlodipine 10mg  and Aldactone 25mg . We will check your potassium levels due to the potential for high potassium with Aldactone.  -PREDIABETES/DIABETES: Your last A1C, a test that measures your average blood sugar levels over the past 3 months, was 6.6, which is in the diabetes range. We will repeat this test today and encourage you to follow a healthy diet and increase your physical activity. We will follow up in 3-4 months to monitor your labs.  -HYPERLIPIDEMIA: With a diagnosis of diabetes, your risk of heart attack and stroke is higher, which means we need to be stricter about your cholesterol levels. We will check your cholesterol today and discuss the potential need for cholesterol medication depending on the results.  -GENERAL HEALTH MAINTENANCE: We encourage you to get a flu shot in mid-September to early October.   INSTRUCTIONS:  Please continue your current medications for hypertension. We will be checking your potassium, A1C, and cholesterol levels today. Remember to follow a healthy diet, reduce carbs and sweets, focus on non-starchy vegetables and lean proteins, and increase your physical activity. We will follow up in 3-4 months to monitor your labs. Also, remember to get your flu shot in mid-September to early  October.

## 2022-12-31 DIAGNOSIS — H35033 Hypertensive retinopathy, bilateral: Secondary | ICD-10-CM | POA: Diagnosis not present

## 2022-12-31 LAB — HM DIABETES EYE EXAM

## 2023-03-17 ENCOUNTER — Ambulatory Visit: Payer: Federal, State, Local not specified - PPO | Admitting: Family

## 2023-03-18 ENCOUNTER — Ambulatory Visit: Payer: Federal, State, Local not specified - PPO | Admitting: Family

## 2023-03-25 ENCOUNTER — Ambulatory Visit: Payer: Federal, State, Local not specified - PPO | Admitting: Family

## 2023-04-01 ENCOUNTER — Ambulatory Visit: Payer: Federal, State, Local not specified - PPO | Admitting: Family

## 2023-04-01 ENCOUNTER — Telehealth: Payer: Self-pay | Admitting: Family

## 2023-04-01 VITALS — BP 129/82 | HR 96 | Temp 99.4°F | Resp 16 | Ht 64.0 in | Wt 186.0 lb

## 2023-04-01 DIAGNOSIS — E1142 Type 2 diabetes mellitus with diabetic polyneuropathy: Secondary | ICD-10-CM | POA: Diagnosis not present

## 2023-04-01 DIAGNOSIS — I10 Essential (primary) hypertension: Secondary | ICD-10-CM | POA: Diagnosis not present

## 2023-04-01 DIAGNOSIS — E119 Type 2 diabetes mellitus without complications: Secondary | ICD-10-CM

## 2023-04-01 DIAGNOSIS — Z23 Encounter for immunization: Secondary | ICD-10-CM

## 2023-04-01 DIAGNOSIS — Z1231 Encounter for screening mammogram for malignant neoplasm of breast: Secondary | ICD-10-CM

## 2023-04-01 NOTE — Progress Notes (Unsigned)
Subjective:     Patient ID: Victoria Mejia, female    DOB: 1971/01/16, 52 y.o.   MRN: [id number]  Chief Complaint  Patient presents with   Diabetes    Here for follow up   Hypertension    Here for follow up   Numbness    Patient complains of right foot numbness    Diabetes  Hypertension    Discussed the use of AI scribe software for clinical note transcription with the patient, who gave verbal consent to proceed.  History of Present Illness   The patient presents with a new complaint of numbness in the top of her foot. She describes the sensation as not being similar to previous experiences of foot cramps, and it is not a tingling sensation. She denies any associated back pain. The patient has a history of diabetes, which is well-controlled with a recent HbA1c of 6.2, achieved through diet and exercise alone. She is also being treated for hypertension with amlodipine 10mg  and Aldactone 25mg , with a current blood pressure of 129/82. The patient has been using Claritin for seasonal allergies, but has not needed it recently. She had an eye exam several months ago and needs to schedule a mammogram. The patient has not yet received a flu shot this year and is due for a shingles vaccine.          Health Maintenance Due  Topic Date Due   OPHTHALMOLOGY EXAM  Never done   Diabetic kidney evaluation - Urine ACR  Never done   Zoster Vaccines- Shingrix (1 of 2) Never done   INFLUENZA VACCINE  12/05/2022   COVID-19 Vaccine (4 - 2023-24 season) 01/05/2023   MAMMOGRAM  03/16/2023    Past Medical History:  Diagnosis Date   History of chicken pox    Hyperlipidemia    Hypertension     Past Surgical History:  Procedure Laterality Date   CESAREAN SECTION  2005   HYSTEROSCOPY N/A 01/25/2015   Procedure: HYSTEROSCOPY;  Surgeon: Brock Bad, MD;  Location: [facility];  Service: Gynecology;  Laterality: N/A;   IUD REMOVAL N/A 01/25/2015   Procedure: INTRAUTERINE DEVICE (IUD)  REMOVAL;  Surgeon: Brock Bad, MD;  Location: [facility];  Service: Gynecology;  Laterality: N/A;    Family History  Problem Relation Age of Onset   Hypertension Father    Alcohol abuse Father    Cirrhosis Father    Stroke Maternal Grandmother    Arthritis Mother    Hyperlipidemia Mother    Heart disease Maternal Uncle 98    Social History   Socioeconomic History   Marital status: Single    Spouse name: Not on file   Number of children: Not on file   Years of education: Not on file   Highest education level: Bachelor's degree (e.g., BA, AB, BS)  Occupational History   Not on file  Tobacco Use   Smoking status: Never   Smokeless tobacco: Never  Substance and Sexual Activity   Alcohol use: Yes    Alcohol/week: 1.0 - 2.0 standard drink of alcohol    Types: 1 - 2 Glasses of wine per week    Comment: occasional   Drug use: No   Sexual activity: Yes    Partners: Male    Birth control/protection: Pill  Other Topics Concern   Not on file  Social History Narrative   Single, lives with her daughter   Daughter 2005    Works in [place] for [employer].  Completed college   No pets   Enjoys television   Social Determinants of Health   Financial Resource Strain: Low Risk  (03/31/2023)   Overall Financial Resource Strain (CARDIA)    Difficulty of Paying Living Expenses: Not hard at all  Food Insecurity: No Food Insecurity (03/31/2023)   Hunger Vital Sign    Worried About Running Out of Food in the Last Year: Never true    Ran Out of Food in the Last Year: Never true  Transportation Needs: No Transportation Needs (03/31/2023)   PRAPARE - Administrator, Civil Service (Medical): No    Lack of Transportation (Non-Medical): No  Physical Activity: Insufficiently Active (03/31/2023)   Exercise Vital Sign    Days of Exercise per Week: 4 days    Minutes of Exercise per Session: 20 min  Stress: No Stress Concern Present (03/31/2023)   [employer] of  Occupational Health - Occupational Stress Questionnaire    Feeling of Stress : Not at all  Social Connections: Moderately Isolated (03/31/2023)   Social Connection and Isolation Panel [NHANES]    Frequency of Communication with Friends and Family: Three times a week    Frequency of Social Gatherings with Friends and Family: Patient declined    Attends Religious Services: More than 4 times per year    Active Member of [employer] or Organizations: No    Attends Engineer, structural: Not on file    Marital Status: Never married  Intimate Partner Violence: Not on file    Outpatient Medications Prior to Visit  Medication Sig Dispense Refill   ALAYCEN 1/35 tablet TAKE 1 TABLET BY MOUTH EVERY DAY 84 tablet 3   amLODipine (NORVASC) 10 MG tablet TAKE 1 TABLET BY MOUTH EVERY DAY 90 tablet 2   ibuprofen (ADVIL) 800 MG tablet Take 1 tablet (800 mg total) by mouth every 8 (eight) hours as needed. 30 tablet 5   spironolactone (ALDACTONE) 25 MG tablet TAKE 1 TABLET BY MOUTH EVERY DAY 90 tablet 1   loratadine (CLARITIN) 10 MG tablet Take by mouth.     No facility-administered medications prior to visit.    No Known Allergies  ROS     Objective:    Physical Exam Constitutional:      General: She is not in acute distress.    Appearance: Normal appearance. She is well-developed.  HENT:     Head: Normocephalic and atraumatic.     Right Ear: External ear normal.     Left Ear: External ear normal.  Eyes:     General: No scleral icterus. Neck:     Thyroid: No thyromegaly.  Cardiovascular:     Rate and Rhythm: Normal rate and regular rhythm.     Heart sounds: Normal heart sounds. No murmur heard. Pulmonary:     Effort: Pulmonary effort is normal. No respiratory distress.     Breath sounds: Normal breath sounds. No wheezing.  Musculoskeletal:     Cervical back: Neck supple.  Skin:    General: Skin is warm and dry.  Neurological:     Mental Status: She is alert and oriented to person,  place, and time.  Psychiatric:        Mood and Affect: Mood normal.        Behavior: Behavior normal.        Thought Content: Thought content normal.        Judgment: Judgment normal.      BP 129/82 (BP Location: Right Arm, Patient  Position: Sitting, Cuff Size: Normal)   Pulse 96   Temp 99.4 F (37.4 C) (Oral)   Resp 16   Ht 5\' 4"  (1.626 m)   Wt 186 lb (84.4 kg)   SpO2 100%   BMI 31.93 kg/m  Wt Readings from Last 3 Encounters:  04/01/23 186 lb (84.4 kg)  12/17/22 191 lb (86.6 kg)  01/11/22 194 lb (88 kg)       Assessment & Plan:   Problem List Items Addressed This Visit       Unprioritized   Diabetic peripheral neuropathy (HCC)   Other Visit Diagnoses     Breast cancer screening by mammogram    -  Primary   Relevant Orders   MM 3D SCREENING MAMMOGRAM BILATERAL BREAST   Controlled type 2 diabetes mellitus without complication, without long-term current use of insulin (HCC)       Relevant Orders   HgB A1c   Urine Microalbumin w/creat. ratio   Basic Metabolic Panel (BMET)       I am having Victoria Mejia maintain her ibuprofen, loratadine, amLODipine, ALYACEN 1/35, and spironolactone.  No orders of the defined types were placed in this encounter.

## 2023-04-01 NOTE — Telephone Encounter (Signed)
Please request DM eye exam from [facility] in [place].

## 2023-04-02 ENCOUNTER — Telehealth: Payer: Self-pay | Admitting: Family

## 2023-04-02 LAB — BASIC METABOLIC PANEL
BUN: 8 mg/dL (ref 6–23)
CO2: 27 meq/L (ref 19–32)
Calcium: 9.5 mg/dL (ref 8.4–10.5)
Chloride: 104 meq/L (ref 96–112)
Creatinine, Ser: 0.6 mg/dL (ref 0.40–1.20)
GFR: 104.49 mL/min (ref >60.00)
Glucose, Bld: 95 mg/dL (ref 70–99)
Potassium: 3.9 meq/L (ref 3.5–5.1)
Sodium: 138 meq/L (ref 135–145)

## 2023-04-02 LAB — MICROALBUMIN / CREATININE URINE RATIO
Creatinine,U: 96.3 mg/dL
Microalb Creat Ratio: 5.7 mg/g (ref 0.0–30.0)
Microalb, Ur: 5.5 mg/dL — ABNORMAL HIGH (ref 0.0–1.9)

## 2023-04-02 LAB — HEMOGLOBIN A1C: Hgb A1c MFr Bld: 6.4 % (ref 4.6–6.5)

## 2023-04-02 MED ORDER — LISINOPRIL 10 MG PO TABS: 10.0000 mg | ORAL_TABLET | Freq: Every day | ORAL | 1 refills | Status: DC

## 2023-04-02 NOTE — Assessment & Plan Note (Signed)
  Well controlled with HbA1c of 6.2. No medications, managing with diet and exercise. -Continue current management. -Complete labs within the next month.

## 2023-04-02 NOTE — Patient Instructions (Signed)
VISIT SUMMARY:  During today's visit, we discussed your new complaint of numbness on the top of your foot, reviewed your well-controlled diabetes and hypertension, and addressed general health maintenance items including vaccinations and routine screenings.  YOUR PLAN:  -TYPE 2 DIABETES: Your diabetes is well-controlled with a recent HbA1c of 6.2, managed through diet and exercise. Continue with your current management plan and complete your labs within the next month.  -HYPERTENSION: Your blood pressure is well-controlled with your current medications, Amlodipine 10mg  and Aldactone 25mg . Continue taking these medications as prescribed.  -GENERAL HEALTH MAINTENANCE: We administered your flu vaccine today. You are also due for a shingles vaccine, which is a two-dose series; we recommend getting this on a Friday due to potential side effects. Please schedule your mammogram at your preferred location and obtain a copy of your last eye exam report from [facility] [place]. Follow up in 3 months after completing your labs.  INSTRUCTIONS:  Please monitor the numbness in your foot and report any worsening symptoms. Complete your labs within the next month and follow up in 3 months after labs are completed. Schedule your mammogram and obtain a copy of your last eye exam report from [facility] [place]. Plan to get your shingles vaccine on a Friday.

## 2023-04-02 NOTE — Telephone Encounter (Signed)
Called and left a voice message asking the patient to give the office a call back.

## 2023-04-02 NOTE — Telephone Encounter (Signed)
Note is made of some protein in her urine from the diabetes. I would recommend that she start lisinopril 10mg  once daily for blood pressure and kidney protection. Stop aldactone, continue amlodipine.  Follow up with me in 1-2 weeks.

## 2023-04-02 NOTE — Assessment & Plan Note (Signed)
New diagnosis based on foot examination today. Discussed DM foot care.

## 2023-04-02 NOTE — Assessment & Plan Note (Signed)
  Controlled with Amlodipine 10mg  and Aldactone 25mg . Blood pressure 129/82. -Continue current medications.

## 2023-04-07 NOTE — Telephone Encounter (Signed)
Electronic request sent 

## 2023-04-17 ENCOUNTER — Encounter (HOSPITAL_BASED_OUTPATIENT_CLINIC_OR_DEPARTMENT_OTHER): Payer: Self-pay

## 2023-04-17 ENCOUNTER — Ambulatory Visit (HOSPITAL_BASED_OUTPATIENT_CLINIC_OR_DEPARTMENT_OTHER)
Admission: RE | Admit: 2023-04-17 | Discharge: 2023-04-17 | Disposition: A | Discharge status: Home / Self Care | Payer: Federal, State, Local not specified - PPO | Source: Ambulatory Visit | Attending: Family | Admitting: Family

## 2023-04-17 DIAGNOSIS — Z1231 Encounter for screening mammogram for malignant neoplasm of breast: Secondary | ICD-10-CM | POA: Insufficient documentation

## 2023-05-26 ENCOUNTER — Ambulatory Visit: Payer: Federal, State, Local not specified - PPO | Admitting: Family

## 2023-05-26 VITALS — BP 113/79 | HR 92 | Temp 97.9°F | Resp 16 | Ht 64.0 in | Wt 190.0 lb

## 2023-05-26 DIAGNOSIS — E785 Hyperlipidemia, unspecified: Secondary | ICD-10-CM | POA: Diagnosis not present

## 2023-05-26 DIAGNOSIS — I10 Essential (primary) hypertension: Secondary | ICD-10-CM | POA: Diagnosis not present

## 2023-05-26 DIAGNOSIS — R809 Proteinuria, unspecified: Secondary | ICD-10-CM

## 2023-05-26 DIAGNOSIS — E118 Type 2 diabetes mellitus with unspecified complications: Secondary | ICD-10-CM

## 2023-05-26 DIAGNOSIS — E1129 Type 2 diabetes mellitus with other diabetic kidney complication: Secondary | ICD-10-CM

## 2023-05-26 DIAGNOSIS — E119 Type 2 diabetes mellitus without complications: Secondary | ICD-10-CM

## 2023-05-26 MED ORDER — LISINOPRIL 2.5 MG PO TABS: 2.5000 mg | ORAL_TABLET | Freq: Every day | ORAL | 1 refills | Status: DC

## 2023-05-26 NOTE — Assessment & Plan Note (Addendum)
BP Readings from Last 3 Encounters:  05/26/23 113/79  04/01/23 129/82  12/17/22 126/73   BP looks great. She needs addition of lisinopril due to microalbuminuria. I don't want her to develop hyperkalemia. D/c aldactone, start low dose lisinopril, continue amlodipine.

## 2023-05-26 NOTE — Patient Instructions (Signed)
VISIT SUMMARY:  You came in today for a follow-up visit to discuss your hypertension, diabetes, and general health. Your blood pressure is well-controlled, and your diabetes management is on track. We also talked about your stress and tiredness due to long work hours. You have not experienced recent headaches, which is good news. We discussed some adjustments to your medications and upcoming vaccinations.  YOUR PLAN:  -HYPERTENSION: Hypertension, or high blood pressure, is when the force of the blood against your artery walls is too high. Your blood pressure is well-controlled with Amlodipine 10mg . We will start you on Lisinopril 2.5mg  daily to protect your kidneys. Stop spironolactone. Please check your potassium level in 2 weeks after starting Lisinopril.  -DIABETES MELLITUS: Diabetes Mellitus is a condition where your blood sugar levels are too high. Your last A1c was 6.4, which is within the goal range. We will repeat your A1c test today to monitor your condition.  -HYPERLIPIDEMIA: Hyperlipidemia is when you have high levels of fats in your blood, which can increase your risk of heart disease. Your last cholesterol level was 138. We discussed the benefits of statin therapy, but you prefer to try lifestyle changes first. We will repeat your cholesterol level today to keep track of your progress.  -GENERAL HEALTH MAINTENANCE: For your general health, we will administer the first dose of the Shingrix vaccine in 2 weeks during your nurse visit. We will also check your blood pressure during that visit. Consider getting your COVID booster at a pharmacy. Please follow up in 3 months for your next appointment.  INSTRUCTIONS:  Please start taking Lisinopril 2.5mg  daily and check your potassium level in 2 weeks. Repeat your A1c and cholesterol tests today. Come back in 2 weeks for your nurse visit to get the Shingrix vaccine and check your blood pressure. Consider getting your COVID booster at a pharmacy.  Follow up in 3 months for your next appointment.

## 2023-05-26 NOTE — Assessment & Plan Note (Signed)
LDL above goal, recommended statin for CV risk reduction. She declines at this time.

## 2023-05-26 NOTE — Progress Notes (Signed)
Subjective:     Patient ID: Victoria Mejia, female    DOB: 07/08/1970, 53 y.o.   MRN: [id number]  Chief Complaint  Patient presents with   Anxiety    Here to complete FMLA   Hypertension    Here for follow up   Diabetes    Here for follow up    HPI  Discussed the use of AI scribe software for clinical note transcription with the patient, who gave verbal consent to proceed.  History of Present Illness   The patient, with a history of hypertension and diabetes, presents for a follow-up visit. She reports ongoing stress and anxiety related to working overtime for several years. She has been managing her stress by taking occasional days off when feeling unwell. She has not been experiencing headaches recently, which were a concern in the past. She reports feeling more tired lately, which she attributes to her long work hours.  The patient's blood pressure is well-controlled, currently at 113/79. She has been prescribed amlodipine 10mg  and lisinopril 10mg , but has not yet started the lisinopril. Though we removed spironolactone from her med list in November, she has continued to take it. She expresses a desire to avoid adding more medications to her regimen if possible. Her last A1c was 6.4, which is under the goal of 7, and she is managing her diabetes primarily through diet. She has not yet received the COVID booster or the shingles vaccine, but is open to receiving them.     Lab Results  Component Value Date   CHOL 232 (H) 12/17/2022   HDL 54.80 12/17/2022   LDLCALC 138 (H) 12/17/2022   TRIG 193.0 (H) 12/17/2022   CHOLHDL 4 12/17/2022        Health Maintenance Due  Topic Date Due   Pneumococcal Vaccine 54-59 Years old (1 of 2 - PCV) Never done   Zoster Vaccines- Shingrix (1 of 2) Never done   COVID-19 Vaccine (4 - 2024-25 season) 01/05/2023    Past Medical History:  Diagnosis Date   History of chicken pox    Hyperlipidemia    Hypertension     Past Surgical  History:  Procedure Laterality Date   CESAREAN SECTION  2005   HYSTEROSCOPY N/A 01/25/2015   Procedure: HYSTEROSCOPY;  Surgeon: Brock Bad, MD;  Location: [facility];  Service: Gynecology;  Laterality: N/A;   IUD REMOVAL N/A 01/25/2015   Procedure: INTRAUTERINE DEVICE (IUD) REMOVAL;  Surgeon: Brock Bad, MD;  Location: [facility];  Service: Gynecology;  Laterality: N/A;    Family History  Problem Relation Age of Onset   Hypertension Father    Alcohol abuse Father    Cirrhosis Father    Stroke Maternal Grandmother    Arthritis Mother    Hyperlipidemia Mother    Heart disease Maternal Uncle 59    Social History   Socioeconomic History   Marital status: Single    Spouse name: Not on file   Number of children: Not on file   Years of education: Not on file   Highest education level: Bachelor's degree (e.g., BA, AB, BS)  Occupational History   Not on file  Tobacco Use   Smoking status: Never   Smokeless tobacco: Never  Substance and Sexual Activity   Alcohol use: Yes    Alcohol/week: 1.0 - 2.0 standard drink of alcohol    Types: 1 - 2 Glasses of wine per week    Comment: occasional   Drug use: No  Sexual activity: Yes    Partners: Male    Birth control/protection: Pill  Other Topics Concern   Not on file  Social History Narrative   Single, lives with her daughter   Daughter 2005    Works in [place] for [employer].    Completed college   No pets   Enjoys television   Social Drivers of Corporate investment banker Strain: Low Risk  (05/19/2023)   Overall Financial Resource Strain (CARDIA)    Difficulty of Paying Living Expenses: Not hard at all  Food Insecurity: No Food Insecurity (05/19/2023)   Hunger Vital Sign    Worried About Running Out of Food in the Last Year: Never true    Ran Out of Food in the Last Year: Never true  Transportation Needs: No Transportation Needs (05/19/2023)   PRAPARE - Administrator, Civil Service (Medical): No    Lack of  Transportation (Non-Medical): No  Physical Activity: Inactive (05/19/2023)   Exercise Vital Sign    Days of Exercise per Week: 0 days    Minutes of Exercise per Session: 20 min  Stress: No Stress Concern Present (05/19/2023)   [employer] of Occupational Health - Occupational Stress Questionnaire    Feeling of Stress : Not at all  Social Connections: Moderately Integrated (05/19/2023)   Social Connection and Isolation Panel [NHANES]    Frequency of Communication with Friends and Family: More than three times a week    Frequency of Social Gatherings with Friends and Family: Once a week    Attends Religious Services: More than 4 times per year    Active Member of [employer] or Organizations: Yes    Attends Banker Meetings: More than 4 times per year    Marital Status: Never married  Recent Concern: Social Connections - Moderately Isolated (03/31/2023)   Social Connection and Isolation Panel [NHANES]    Frequency of Communication with Friends and Family: Three times a week    Frequency of Social Gatherings with Friends and Family: Patient declined    Attends Religious Services: More than 4 times per year    Active Member of [employer] or Organizations: No    Attends Engineer, structural: Not on file    Marital Status: Never married  Intimate Partner Violence: Not on file    Outpatient Medications Prior to Visit  Medication Sig Dispense Refill   ALAYCEN 1/35 tablet TAKE 1 TABLET BY MOUTH EVERY DAY 84 tablet 3   amLODipine (NORVASC) 10 MG tablet TAKE 1 TABLET BY MOUTH EVERY DAY 90 tablet 2   ibuprofen (ADVIL) 800 MG tablet Take 1 tablet (800 mg total) by mouth every 8 (eight) hours as needed. 30 tablet 5   lisinopril (ZESTRIL) 10 MG tablet Take 1 tablet (10 mg total) by mouth daily. 90 tablet 1   loratadine (CLARITIN) 10 MG tablet Take by mouth.     No facility-administered medications prior to visit.    No Known Allergies  ROS See HPI    Objective:    Physical  Exam Constitutional:      General: She is not in acute distress.    Appearance: Normal appearance. She is well-developed.  HENT:     Head: Normocephalic and atraumatic.     Right Ear: External ear normal.     Left Ear: External ear normal.  Eyes:     General: No scleral icterus. Neck:     Thyroid: No thyromegaly.  Cardiovascular:  Rate and Rhythm: Normal rate and regular rhythm.     Heart sounds: Normal heart sounds. No murmur heard. Pulmonary:     Effort: Pulmonary effort is normal. No respiratory distress.     Breath sounds: Normal breath sounds. No wheezing.  Musculoskeletal:     Cervical back: Neck supple.  Skin:    General: Skin is warm and dry.  Neurological:     Mental Status: She is alert and oriented to person, place, and time.  Psychiatric:        Mood and Affect: Mood normal.        Behavior: Behavior normal.        Thought Content: Thought content normal.        Judgment: Judgment normal.      BP 113/79 (BP Location: Right Arm, Patient Position: Sitting, Cuff Size: Large)   Pulse 92   Temp 97.9 F (36.6 C) (Oral)   Resp 16   Ht 5\' 4"  (1.626 m)   Wt 190 lb (86.2 kg)   SpO2 99%   BMI 32.61 kg/m  Wt Readings from Last 3 Encounters:  05/26/23 190 lb (86.2 kg)  04/01/23 186 lb (84.4 kg)  12/17/22 191 lb (86.6 kg)       Assessment & Plan:   Problem List Items Addressed This Visit       Unprioritized   Type 2 diabetes with complication [facility])   Lab Results  Component Value Date   HGBA1C 6.4 04/01/2023   HGBA1C 6.2 12/17/2022   HGBA1C 6.6 (H) 12/20/2021   Lab Results  Component Value Date   MICROALBUR 5.5 (H) 04/01/2023   LDLCALC 138 (H) 12/17/2022   CREATININE 0.60 04/01/2023   Diet controlled.  Update A1C.      Relevant Medications   lisinopril (ZESTRIL) 2.5 MG tablet   Hyperlipidemia   LDL above goal, recommended statin for CV risk reduction. She declines at this time.       Relevant Medications   lisinopril (ZESTRIL) 2.5 MG  tablet   Other Relevant Orders   Lipid panel   HTN (hypertension) - Primary   BP Readings from Last 3 Encounters:  05/26/23 113/79  04/01/23 129/82  12/17/22 126/73   BP looks great. She needs addition of lisinopril due to microalbuminuria. I don't want her to develop hyperkalemia. D/c aldactone, start low dose lisinopril, continue amlodipine.       Relevant Medications   lisinopril (ZESTRIL) 2.5 MG tablet   Other Relevant Orders   Basic Metabolic Panel (BMET)   Other Visit Diagnoses       Microalbuminuria       Relevant Medications   lisinopril (ZESTRIL) 2.5 MG tablet   Other Relevant Orders   Basic Metabolic Panel (BMET)     Controlled type 2 diabetes mellitus without complication, without long-term current use of insulin (HCC)       Relevant Medications   lisinopril (ZESTRIL) 2.5 MG tablet   Other Relevant Orders   HgB A1c       I have discontinued Javona M. Schoeneck's lisinopril. I am also having her start on lisinopril. Additionally, I am having her maintain her ibuprofen, loratadine, amLODipine, and ALYACEN 1/35.  Meds ordered this encounter  Medications   lisinopril (ZESTRIL) 2.5 MG tablet    Sig: Take 1 tablet (2.5 mg total) by mouth daily.    Dispense:  90 tablet    Refill:  1    Supervising Provider:   Danise Edge A [[id number]]

## 2023-05-26 NOTE — Assessment & Plan Note (Signed)
Lab Results  Component Value Date   HGBA1C 6.4 04/01/2023   HGBA1C 6.2 12/17/2022   HGBA1C 6.6 (H) 12/20/2021   Lab Results  Component Value Date   MICROALBUR 5.5 (H) 04/01/2023   LDLCALC 138 (H) 12/17/2022   CREATININE 0.60 04/01/2023   Diet controlled.  Update A1C.

## 2023-06-10 ENCOUNTER — Telehealth: Payer: Self-pay | Admitting: Family

## 2023-06-10 NOTE — Telephone Encounter (Signed)
Pt dropped off FMLA forms for her provider to fill out. PT requested it all faxed to its designated place and will like a call once it's completed. Paperwork is in the providers box.  CB# [phone] Please advise, Thanks

## 2023-06-11 NOTE — Telephone Encounter (Signed)
 Form received and will be corrected as requested.

## 2023-06-11 NOTE — Telephone Encounter (Signed)
 Patient will be contacted when form ready

## 2023-06-16 ENCOUNTER — Telehealth: Payer: Self-pay | Admitting: Family

## 2023-06-16 NOTE — Telephone Encounter (Signed)
 Copied from CRM [id number]. Topic: General - Other >> Jun 16, 2023  8:19 AM [place] E wrote: Reason for CRM: Patient called wondering if her FMLA paperwork is completed and ready for pick up. Callback number for patient is [phone].

## 2023-06-16 NOTE — Telephone Encounter (Signed)
 Copied from CRM [id number]. Topic: General - Other >> Jun 16, 2023 12:00 PM Corin V wrote: Reason for CRM: Patient is calling again asking for a status update on her FMLA paperwork. Please call her back at [phone]

## 2023-06-17 ENCOUNTER — Ambulatory Visit: Payer: Federal, State, Local not specified - PPO | Admitting: Family

## 2023-06-17 ENCOUNTER — Other Ambulatory Visit (INDEPENDENT_AMBULATORY_CARE_PROVIDER_SITE_OTHER): Payer: Federal, State, Local not specified - PPO

## 2023-06-17 DIAGNOSIS — R809 Proteinuria, unspecified: Secondary | ICD-10-CM

## 2023-06-18 ENCOUNTER — Encounter: Payer: Self-pay | Admitting: Family

## 2023-06-18 LAB — BASIC METABOLIC PANEL
BUN: 8 mg/dL (ref 6–23)
CO2: 22 meq/L (ref 19–32)
Calcium: 9 mg/dL (ref 8.4–10.5)
Chloride: 104 meq/L (ref 96–112)
Creatinine, Ser: 0.61 mg/dL (ref 0.40–1.20)
GFR: 103.92 mL/min (ref >60.00)
Glucose, Bld: 93 mg/dL (ref 70–99)
Potassium: 3.4 meq/L — ABNORMAL LOW (ref 3.5–5.1)
Sodium: 138 meq/L (ref 135–145)

## 2023-07-17 ENCOUNTER — Other Ambulatory Visit: Payer: Self-pay | Admitting: Family

## 2023-07-22 ENCOUNTER — Ambulatory Visit: Admitting: Family

## 2023-07-22 VITALS — BP 119/71 | HR 79 | Temp 99.5°F | Resp 16 | Ht 64.0 in | Wt 187.0 lb

## 2023-07-22 DIAGNOSIS — I10 Essential (primary) hypertension: Secondary | ICD-10-CM

## 2023-07-22 DIAGNOSIS — E118 Type 2 diabetes mellitus with unspecified complications: Secondary | ICD-10-CM

## 2023-07-22 DIAGNOSIS — E785 Hyperlipidemia, unspecified: Secondary | ICD-10-CM | POA: Diagnosis not present

## 2023-07-22 DIAGNOSIS — E119 Type 2 diabetes mellitus without complications: Secondary | ICD-10-CM

## 2023-07-22 NOTE — Progress Notes (Unsigned)
 Subjective:     Patient ID: Victoria Mejia, female    DOB: 22-Sep-1970, 53 y.o.   MRN: [id number]  Chief Complaint  Patient presents with   Hypertension    Here for follow up    HPI  Discussed the use of AI scribe software for clinical note transcription with the patient, who gave verbal consent to proceed.  History of Present Illness   Victoria Mejia is a 53 year old female with hypertension and diabetes who presents for a routine follow-up visit.  Hypertension is well-controlled on her current medication regimen, which includes amlodipine 10 mg and lisinopril. She regularly monitors her blood pressure and has not experienced any recent issues with elevated readings.  Her diabetes is currently managed through dietary modifications, with her last A1c in November being 6.4%. She has improved her diet by increasing her intake of vegetables and fruits and practicing better portion control, particularly avoiding eating at night. She is not currently engaging in regular exercise but plans to start by going to the park with her daughter.  Her cholesterol levels were noted to be high in August, with an LDL of 138 mg/dL. She has not yet started on any statin therapy.  She recently picked up a refill of amlodipine and notes having a surplus of the medication. Her automatic refills have been stopped, and she will need to call for future refills.    Health Maintenance Due  Topic Date Due   Pneumococcal Vaccine 11-56 Years old (1 of 2 - PCV) Never done   Zoster Vaccines- Shingrix (1 of 2) Never done   COVID-19 Vaccine (4 - 2024-25 season) 01/05/2023    Past Medical History:  Diagnosis Date   History of chicken pox    Hyperlipidemia    Hypertension     Past Surgical History:  Procedure Laterality Date   CESAREAN SECTION  2005   HYSTEROSCOPY N/A 01/25/2015   Procedure: HYSTEROSCOPY;  Surgeon: Brock Bad, MD;  Location: [facility];  Service: Gynecology;  Laterality:  N/A;   IUD REMOVAL N/A 01/25/2015   Procedure: INTRAUTERINE DEVICE (IUD) REMOVAL;  Surgeon: Brock Bad, MD;  Location: [facility];  Service: Gynecology;  Laterality: N/A;    Family History  Problem Relation Age of Onset   Hypertension Father    Alcohol abuse Father    Cirrhosis Father    Stroke Maternal Grandmother    Arthritis Mother    Hyperlipidemia Mother    Heart disease Maternal Uncle 56    Social History   Socioeconomic History   Marital status: Single    Spouse name: Not on file   Number of children: Not on file   Years of education: Not on file   Highest education level: Bachelor's degree (e.g., BA, AB, BS)  Occupational History   Not on file  Tobacco Use   Smoking status: Never   Smokeless tobacco: Never  Substance and Sexual Activity   Alcohol use: Yes    Alcohol/week: 1.0 - 2.0 standard drink of alcohol    Types: 1 - 2 Glasses of wine per week    Comment: occasional   Drug use: No   Sexual activity: Yes    Partners: Male    Birth control/protection: Pill  Other Topics Concern   Not on file  Social History Narrative   Single, lives with her daughter   Daughter 2005    Works in [place] for [employer].    Completed college   No  pets   Enjoys television   Social Drivers of Corporate investment banker Strain: Low Risk  (05/19/2023)   Overall Financial Resource Strain (CARDIA)    Difficulty of Paying Living Expenses: Not hard at all  Food Insecurity: No Food Insecurity (05/19/2023)   Hunger Vital Sign    Worried About Running Out of Food in the Last Year: Never true    Ran Out of Food in the Last Year: Never true  Transportation Needs: No Transportation Needs (05/19/2023)   PRAPARE - Administrator, Civil Service (Medical): No    Lack of Transportation (Non-Medical): No  Physical Activity: Inactive (05/19/2023)   Exercise Vital Sign    Days of Exercise per Week: 0 days    Minutes of Exercise per Session: 20 min  Stress: No Stress Concern  Present (05/19/2023)   [employer] of Occupational Health - Occupational Stress Questionnaire    Feeling of Stress : Not at all  Social Connections: Moderately Integrated (05/19/2023)   Social Connection and Isolation Panel [NHANES]    Frequency of Communication with Friends and Family: More than three times a week    Frequency of Social Gatherings with Friends and Family: Once a week    Attends Religious Services: More than 4 times per year    Active Member of [employer] or Organizations: Yes    Attends Banker Meetings: More than 4 times per year    Marital Status: Never married  Recent Concern: Social Connections - Moderately Isolated (03/31/2023)   Social Connection and Isolation Panel [NHANES]    Frequency of Communication with Friends and Family: Three times a week    Frequency of Social Gatherings with Friends and Family: Patient declined    Attends Religious Services: More than 4 times per year    Active Member of [employer] or Organizations: No    Attends Engineer, structural: Not on file    Marital Status: Never married  Intimate Partner Violence: Not on file    Outpatient Medications Prior to Visit  Medication Sig Dispense Refill   ALAYCEN 1/35 tablet TAKE 1 TABLET BY MOUTH EVERY DAY 84 tablet 3   amLODipine (NORVASC) 10 MG tablet TAKE 1 TABLET BY MOUTH EVERY DAY 90 tablet 0   ibuprofen (ADVIL) 800 MG tablet Take 1 tablet (800 mg total) by mouth every 8 (eight) hours as needed. 30 tablet 5   lisinopril (ZESTRIL) 2.5 MG tablet Take 1 tablet (2.5 mg total) by mouth daily. 90 tablet 1   loratadine (CLARITIN) 10 MG tablet Take by mouth.     No facility-administered medications prior to visit.    No Known Allergies  ROS     Objective:    Physical Exam Constitutional:      General: She is not in acute distress.    Appearance: Normal appearance. She is well-developed.  HENT:     Head: Normocephalic and atraumatic.     Right Ear: External ear normal.      Left Ear: External ear normal.  Eyes:     General: No scleral icterus. Neck:     Thyroid: No thyromegaly.  Cardiovascular:     Rate and Rhythm: Normal rate and regular rhythm.     Heart sounds: Normal heart sounds. No murmur heard. Pulmonary:     Effort: Pulmonary effort is normal. No respiratory distress.     Breath sounds: Normal breath sounds. No wheezing.  Musculoskeletal:     Cervical back: Neck supple.  Skin:    General: Skin is warm and dry.  Neurological:     Mental Status: She is alert and oriented to person, place, and time.  Psychiatric:        Mood and Affect: Mood normal.        Behavior: Behavior normal.        Thought Content: Thought content normal.        Judgment: Judgment normal.      BP 119/71 (BP Location: Right Arm, Patient Position: Sitting, Cuff Size: Normal)   Pulse 79   Temp 99.5 F (37.5 C) (Oral)   Resp 16   Ht 5\' 4"  (1.626 m)   Wt 187 lb (84.8 kg)   SpO2 (!) 10%   BMI 32.10 kg/m  Wt Readings from Last 3 Encounters:  07/22/23 187 lb (84.8 kg)  05/26/23 190 lb (86.2 kg)  04/01/23 186 lb (84.4 kg)       Assessment & Plan:   Problem List Items Addressed This Visit       Unprioritized   Type 2 diabetes with complication (HCC)   Lab Results  Component Value Date   HGBA1C 6.4 04/01/2023   HGBA1C 6.2 12/17/2022   HGBA1C 6.6 (H) 12/20/2021   Lab Results  Component Value Date   MICROALBUR 5.5 (H) 04/01/2023   LDLCALC 138 (H) 12/17/2022   CREATININE 0.61 06/17/2023   Diet-controlled diabetes with recent dietary improvements. - Continue current diet and lifestyle modifications. - Encourage regular physical activity. - Monitor hemoglobin A1c levels.       Hyperlipidemia   Lab Results  Component Value Date   CHOL 232 (H) 12/17/2022   HDL 54.80 12/17/2022   LDLCALC 138 (H) 12/17/2022   TRIG 193.0 (H) 12/17/2022   CHOLHDL 4 12/17/2022   LDL cholesterol above recommended level; discussed statin therapy for cardiovascular  risk reduction. She declines opting to continue to focus on lifestyle changes. - Recheck lipid panel. - Continue to discuss potential benefits of statin therapy in future visits.        Relevant Orders   Lipid panel (Completed)   HTN (hypertension)    Blood pressure well-controlled on current medication regimen. - Continue current medications: amlodipine 10 mg and lisinopril. - Monitor blood pressure regularly.      Other Visit Diagnoses       Controlled type 2 diabetes mellitus without complication, without long-term current use of insulin (HCC)    -  Primary   Relevant Orders   Urine Microalbumin w/creat. ratio (Completed)   Basic Metabolic Panel (BMET) (Completed)   HgB A1c (Completed)       I am having Zariel M. Aundria Rud maintain her ibuprofen, loratadine, ALYACEN 1/35, lisinopril, and amLODipine.  No orders of the defined types were placed in this encounter.

## 2023-07-22 NOTE — Assessment & Plan Note (Signed)
 Lab Results  Component Value Date   HGBA1C 6.4 04/01/2023   HGBA1C 6.2 12/17/2022   HGBA1C 6.6 (H) 12/20/2021   Lab Results  Component Value Date   MICROALBUR 5.5 (H) 04/01/2023   LDLCALC 138 (H) 12/17/2022   CREATININE 0.61 06/17/2023   Diet-controlled diabetes with recent dietary improvements. - Continue current diet and lifestyle modifications. - Encourage regular physical activity. - Monitor hemoglobin A1c levels.

## 2023-07-22 NOTE — Assessment & Plan Note (Signed)
 Lab Results  Component Value Date   CHOL 232 (H) 12/17/2022   HDL 54.80 12/17/2022   LDLCALC 138 (H) 12/17/2022   TRIG 193.0 (H) 12/17/2022   CHOLHDL 4 12/17/2022   LDL cholesterol above recommended level; discussed statin therapy for cardiovascular risk reduction. She declines opting to continue to focus on lifestyle changes. - Recheck lipid panel. - Continue to discuss potential benefits of statin therapy in future visits.

## 2023-07-23 ENCOUNTER — Encounter: Payer: Self-pay | Admitting: Family

## 2023-07-23 LAB — LIPID PANEL
Cholesterol: 231 mg/dL — ABNORMAL HIGH (ref 0–200)
HDL: 54.8 mg/dL (ref >39.00)
LDL Cholesterol: 148 mg/dL — ABNORMAL HIGH (ref 0–99)
NonHDL: 176.01
Total CHOL/HDL Ratio: 4
Triglycerides: 140 mg/dL (ref 0.0–149.0)
VLDL: 28 mg/dL (ref 0.0–40.0)

## 2023-07-23 LAB — BASIC METABOLIC PANEL
BUN: 7 mg/dL (ref 6–23)
CO2: 24 meq/L (ref 19–32)
Calcium: 10 mg/dL (ref 8.4–10.5)
Chloride: 105 meq/L (ref 96–112)
Creatinine, Ser: 0.57 mg/dL (ref 0.40–1.20)
GFR: 105.56 mL/min (ref >60.00)
Glucose, Bld: 89 mg/dL (ref 70–99)
Potassium: 4.5 meq/L (ref 3.5–5.1)
Sodium: 140 meq/L (ref 135–145)

## 2023-07-23 LAB — MICROALBUMIN / CREATININE URINE RATIO
Creatinine,U: 127.5 mg/dL
Microalb Creat Ratio: 57.5 mg/g — ABNORMAL HIGH (ref 0.0–30.0)
Microalb, Ur: 7.3 mg/dL — ABNORMAL HIGH (ref 0.0–1.9)

## 2023-07-23 LAB — HEMOGLOBIN A1C: Hgb A1c MFr Bld: 6.5 % (ref 4.6–6.5)

## 2023-07-23 NOTE — Assessment & Plan Note (Signed)
  Blood pressure well-controlled on current medication regimen. - Continue current medications: amlodipine 10 mg and lisinopril. - Monitor blood pressure regularly.

## 2023-07-23 NOTE — Patient Instructions (Signed)
 VISIT SUMMARY:  Victoria Mejia, a 53 year old female with hypertension and diabetes, came in for a routine follow-up visit. Her hypertension is well-controlled with her current medications, and her diabetes is managed through dietary changes. She has also been working on improving her cholesterol levels. We discussed her current health status and made plans for ongoing management.  YOUR PLAN:  -TYPE 2 DIABETES MELLITUS: Type 2 Diabetes Mellitus is a condition where the body does not use insulin properly, leading to high blood sugar levels. Your diabetes is currently managed through dietary changes, and you are encouraged to continue with your current diet and lifestyle modifications. Regular physical activity is also recommended, and we will keep monitoring your hemoglobin A1c levels.  -HYPERTENSION: Hypertension, or high blood pressure, is a condition where the force of the blood against your artery walls is too high. Your blood pressure is well-controlled with your current medications, amlodipine 10 mg and lisinopril. Please continue taking these medications as prescribed and monitor your blood pressure regularly.  -HYPERLIPIDEMIA: Hyperlipidemia is a condition where there are high levels of fats (lipids) in the blood, such as cholesterol. Your LDL cholesterol is above the recommended level. We will recheck your lipid panel and continue discussing the potential benefits of starting statin therapy to reduce cardiovascular risk in future visits.  -GENERAL HEALTH MAINTENANCE: You are managing your health conditions well with medication and lifestyle changes. We will check your potassium levels in today's lab work and schedule a follow-up appointment in six months.  INSTRUCTIONS:  Please call for future refills of amlodipine as your automatic refills have been stopped. Schedule a follow-up appointment in six months.

## 2023-07-30 ENCOUNTER — Ambulatory Visit: Payer: Federal, State, Local not specified - PPO | Admitting: Family

## 2023-09-24 ENCOUNTER — Other Ambulatory Visit: Payer: Self-pay | Admitting: Obstetrics

## 2023-09-24 DIAGNOSIS — Z3041 Encounter for surveillance of contraceptive pills: Secondary | ICD-10-CM

## 2023-11-04 ENCOUNTER — Ambulatory Visit: Admitting: Family

## 2023-11-04 VITALS — BP 129/69 | HR 99 | Temp 99.5°F | Resp 12 | Ht 64.0 in | Wt 186.8 lb

## 2023-11-04 DIAGNOSIS — M25512 Pain in left shoulder: Secondary | ICD-10-CM | POA: Diagnosis not present

## 2023-11-04 DIAGNOSIS — Z23 Encounter for immunization: Secondary | ICD-10-CM

## 2023-11-04 DIAGNOSIS — I10 Essential (primary) hypertension: Secondary | ICD-10-CM | POA: Diagnosis not present

## 2023-11-04 DIAGNOSIS — E785 Hyperlipidemia, unspecified: Secondary | ICD-10-CM | POA: Diagnosis not present

## 2023-11-04 DIAGNOSIS — R059 Cough, unspecified: Secondary | ICD-10-CM

## 2023-11-04 DIAGNOSIS — E118 Type 2 diabetes mellitus with unspecified complications: Secondary | ICD-10-CM

## 2023-11-04 NOTE — Assessment & Plan Note (Signed)
 Lab Results  Component Value Date   CHOL 231 (H) 07/22/2023   HDL 54.80 07/22/2023   LDLCALC 148 (H) 07/22/2023   TRIG 140.0 07/22/2023   CHOLHDL 4 07/22/2023   Declines statin at this time.

## 2023-11-04 NOTE — Progress Notes (Unsigned)
 Subjective:     Patient ID: Victoria Mejia, female    DOB: 1971-03-30, 53 y.o.   MRN: [id number]  Chief Complaint  Patient presents with   left shoulder pain   Cough    Has been taking corcedin and mucinex     HPI  Discussed the use of AI scribe software for clinical note transcription with the patient, who gave verbal consent to proceed.  History of Present Illness Victoria Mejia is a 53 year old female who presents with left shoulder pain after a fall.  She fell at home after losing her balance while wearing shoes with an upturned front. She initially did not realize she had fallen on her shoulder but later experienced significant discomfort in the left shoulder. The discomfort is present with movement, but she can lift her arm and hold objects without shaking.  She has diabetes, managed with diet, with a recent A1c of 6.5%. Her LDL cholesterol is 148 mg/dL. She takes amlodipine  10 mg and a small dose of lisinopril  for blood pressure.  She has a persistent cough that began after a cold in March-April. The cough is initially dry but can become rattling and worsens with overheating. She uses Mucinex for management. She denies significant pain in the shoulder and does not feel sick despite the cough.      Health Maintenance Due  Topic Date Due   Hepatitis B Vaccines (1 of 3 - 19+ 3-dose series) Never done   Zoster Vaccines- Shingrix (1 of 2) Never done    Past Medical History:  Diagnosis Date   History of chicken pox    Hyperlipidemia    Hypertension     Past Surgical History:  Procedure Laterality Date   CESAREAN SECTION  2005   HYSTEROSCOPY N/A 01/25/2015   Procedure: HYSTEROSCOPY;  Surgeon: Carlin DELENA Centers, MD;  Location: [facility];  Service: Gynecology;  Laterality: N/A;   IUD REMOVAL N/A 01/25/2015   Procedure: INTRAUTERINE DEVICE (IUD) REMOVAL;  Surgeon: Carlin DELENA Centers, MD;  Location: [facility];  Service: Gynecology;  Laterality: N/A;    Family  History  Problem Relation Age of Onset   Hypertension Father    Alcohol abuse Father    Cirrhosis Father    Stroke Maternal Grandmother    Arthritis Mother    Hyperlipidemia Mother    Heart disease Maternal Uncle 33    Social History   Socioeconomic History   Marital status: Single    Spouse name: Not on file   Number of children: Not on file   Years of education: Not on file   Highest education level: Bachelor's degree (e.g., BA, AB, BS)  Occupational History   Not on file  Tobacco Use   Smoking status: Never   Smokeless tobacco: Never  Substance and Sexual Activity   Alcohol use: Yes    Alcohol/week: 1.0 - 2.0 standard drink of alcohol    Types: 1 - 2 Glasses of wine per week    Comment: occasional   Drug use: No   Sexual activity: Yes    Partners: Male    Birth control/protection: Pill  Other Topics Concern   Not on file  Social History Narrative   Single, lives with her daughter   Daughter 2005    Works in [place] for [employer].    Completed college   No pets   Enjoys television   Social Drivers of Health   Financial Resource Strain: Low Risk  (11/02/2023)  Overall Financial Resource Strain (CARDIA)    Difficulty of Paying Living Expenses: Not hard at all  Food Insecurity: No Food Insecurity (11/02/2023)   Hunger Vital Sign    Worried About Running Out of Food in the Last Year: Never true    Ran Out of Food in the Last Year: Never true  Transportation Needs: No Transportation Needs (11/02/2023)   PRAPARE - Administrator, Civil Service (Medical): No    Lack of Transportation (Non-Medical): No  Physical Activity: Inactive (11/02/2023)   Exercise Vital Sign    Days of Exercise per Week: 0 days    Minutes of Exercise per Session: Not on file  Stress: No Stress Concern Present (11/02/2023)   [employer] of Occupational Health - Occupational Stress Questionnaire    Feeling of Stress: Only a little  Social Connections: Moderately Isolated  (11/02/2023)   Social Connection and Isolation Panel    Frequency of Communication with Friends and Family: Three times a week    Frequency of Social Gatherings with Friends and Family: Once a week    Attends Religious Services: 1 to 4 times per year    Active Member of [employer] or Organizations: No    Attends Engineer, structural: Not on file    Marital Status: Never married  Intimate Partner Violence: Not on file    Outpatient Medications Prior to Visit  Medication Sig Dispense Refill   ALYACEN 1/35 tablet TAKE 1 TABLET BY MOUTH EVERY DAY 84 tablet 3   amLODipine  (NORVASC ) 10 MG tablet TAKE 1 TABLET BY MOUTH EVERY DAY 90 tablet 0   ibuprofen  (ADVIL ) 800 MG tablet Take 1 tablet (800 mg total) by mouth every 8 (eight) hours as needed. 30 tablet 5   lisinopril  (ZESTRIL ) 2.5 MG tablet Take 1 tablet (2.5 mg total) by mouth daily. 90 tablet 1   loratadine (CLARITIN) 10 MG tablet Take by mouth.     No facility-administered medications prior to visit.    No Known Allergies  ROS     Objective:    Physical Exam Constitutional:      General: She is not in acute distress.    Appearance: Normal appearance. She is well-developed.  HENT:     Head: Normocephalic and atraumatic.     Right Ear: External ear normal.     Left Ear: External ear normal.  Eyes:     General: No scleral icterus. Neck:     Thyroid : No thyromegaly.  Cardiovascular:     Rate and Rhythm: Normal rate and regular rhythm.     Heart sounds: Normal heart sounds. No murmur heard. Pulmonary:     Effort: Pulmonary effort is normal. No respiratory distress.     Breath sounds: Normal breath sounds. No wheezing.  Musculoskeletal:     Cervical back: Neck supple.     Comments: Left shoulder is without swelling or tenderness.  Able to partially abduct left arm with some pain.    Skin:    General: Skin is warm and dry.  Neurological:     Mental Status: She is alert and oriented to person, place, and time.   Psychiatric:        Mood and Affect: Mood normal.        Behavior: Behavior normal.        Thought Content: Thought content normal.        Judgment: Judgment normal.      BP 129/69 (BP Location: Right Arm, Patient Position: Sitting,  Cuff Size: Normal)   Pulse 99   Temp 99.5 F (37.5 C) (Oral)   Resp 12   Ht 5' 4 (1.626 m)   Wt 186 lb 12.8 oz (84.7 kg)   LMP 11/04/2023   SpO2 97%   BMI 32.06 kg/m  Wt Readings from Last 3 Encounters:  11/04/23 186 lb 12.8 oz (84.7 kg)  07/22/23 187 lb (84.8 kg)  05/26/23 190 lb (86.2 kg)       Assessment & Plan:   Problem List Items Addressed This Visit       Unprioritized   Type 2 diabetes with complication [facility]) - Primary   Lab Results  Component Value Date   HGBA1C 6.5 07/22/2023   HGBA1C 6.4 04/01/2023   HGBA1C 6.2 12/17/2022   Lab Results  Component Value Date   MICROALBUR 7.3 (H) 07/22/2023   LDLCALC 148 (H) 07/22/2023   CREATININE 0.57 07/22/2023    Diet-controlled with A1c of 6.5%. Discussed cardiovascular risk and statin benefits. Prefers diet and exercise modifications. - Order lab tests for diabetes monitoring. - Schedule lab appointment for blood draw. - Discussed potential statin therapy; opted to focus on lifestyle modifications.       Relevant Orders   HgB A1c   Basic Metabolic Panel (BMET)   Urine Microalbumin w/creat. ratio   Hyperlipidemia   Lab Results  Component Value Date   CHOL 231 (H) 07/22/2023   HDL 54.80 07/22/2023   LDLCALC 148 (H) 07/22/2023   TRIG 140.0 07/22/2023   CHOLHDL 4 07/22/2023    LDL 148 mg/dL. Guidelines recommend LDL <70 mg/dL for diabetics. Discussed statins, prefers lifestyle changes. - Monitor cholesterol levels. - Encourage dietary and exercise modifications.       HTN (hypertension)   BP Readings from Last 3 Encounters:  11/04/23 129/69  07/22/23 119/71  05/26/23 113/79   Stable on lisinopril  and amlodipine . Continue same.       Acute pain of left  shoulder   New. ? Rotator cuff strain. Will obtain X-ray to rule our fracture. If pain persists plan referral to ortho/sports med.      Relevant Orders   DG Shoulder Left   Other Visit Diagnoses       Need for pneumococcal vaccination       Relevant Orders   Pneumococcal conjugate vaccine 20-valent (Prevnar 20) (Completed)       I am having Victoria Mejia maintain her ibuprofen , loratadine, lisinopril , amLODipine , and ALYACEN 1/35.  No orders of the defined types were placed in this encounter.

## 2023-11-04 NOTE — Assessment & Plan Note (Signed)
 Lab Results  Component Value Date   HGBA1C 6.5 07/22/2023   HGBA1C 6.4 04/01/2023   HGBA1C 6.2 12/17/2022   Lab Results  Component Value Date   MICROALBUR 7.3 (H) 07/22/2023   LDLCALC 148 (H) 07/22/2023   CREATININE 0.57 07/22/2023    Diet-controlled with A1c of 6.5%. Discussed cardiovascular risk and statin benefits. Prefers diet and exercise modifications. - Order lab tests for diabetes monitoring. - Schedule lab appointment for blood draw. - Discussed potential statin therapy; opted to focus on lifestyle modifications.

## 2023-11-04 NOTE — Assessment & Plan Note (Signed)
 BP Readings from Last 3 Encounters:  11/04/23 129/69  07/22/23 119/71  05/26/23 113/79   Stable on lisinopril  and amlodipine . Continue same.

## 2023-11-05 ENCOUNTER — Other Ambulatory Visit (INDEPENDENT_AMBULATORY_CARE_PROVIDER_SITE_OTHER)

## 2023-11-05 ENCOUNTER — Ambulatory Visit: Admitting: Family

## 2023-11-05 ENCOUNTER — Ambulatory Visit (HOSPITAL_BASED_OUTPATIENT_CLINIC_OR_DEPARTMENT_OTHER)
Admission: RE | Admit: 2023-11-05 | Discharge: 2023-11-05 | Disposition: A | Discharge status: Home / Self Care | Source: Ambulatory Visit | Attending: Family | Admitting: Family

## 2023-11-05 ENCOUNTER — Ambulatory Visit: Payer: Self-pay | Admitting: Family

## 2023-11-05 ENCOUNTER — Telehealth: Payer: Self-pay | Admitting: Family

## 2023-11-05 DIAGNOSIS — S4292XA Fracture of left shoulder girdle, part unspecified, initial encounter for closed fracture: Secondary | ICD-10-CM

## 2023-11-05 DIAGNOSIS — M25512 Pain in left shoulder: Secondary | ICD-10-CM | POA: Insufficient documentation

## 2023-11-05 DIAGNOSIS — E118 Type 2 diabetes mellitus with unspecified complications: Secondary | ICD-10-CM | POA: Diagnosis not present

## 2023-11-05 DIAGNOSIS — R059 Cough, unspecified: Secondary | ICD-10-CM | POA: Insufficient documentation

## 2023-11-05 DIAGNOSIS — S43005A Unspecified dislocation of left shoulder joint, initial encounter: Secondary | ICD-10-CM | POA: Diagnosis not present

## 2023-11-05 DIAGNOSIS — S43035A Inferior dislocation of left humerus, initial encounter: Secondary | ICD-10-CM | POA: Diagnosis not present

## 2023-11-05 DIAGNOSIS — S43015A Anterior dislocation of left humerus, initial encounter: Secondary | ICD-10-CM | POA: Diagnosis not present

## 2023-11-05 LAB — MICROALBUMIN / CREATININE URINE RATIO
Creatinine,U: 148.7 mg/dL
Microalb Creat Ratio: 47.1 mg/g — ABNORMAL HIGH (ref 0.0–30.0)
Microalb, Ur: 7 mg/dL — ABNORMAL HIGH (ref 0.0–1.9)

## 2023-11-05 LAB — BASIC METABOLIC PANEL WITH GFR
BUN: 7 mg/dL (ref 6–23)
CO2: 26 meq/L (ref 19–32)
Calcium: 9.4 mg/dL (ref 8.4–10.5)
Chloride: 103 meq/L (ref 96–112)
Creatinine, Ser: 0.58 mg/dL (ref 0.40–1.20)
GFR: 104.91 mL/min (ref >60.00)
Glucose, Bld: 120 mg/dL — ABNORMAL HIGH (ref 70–99)
Potassium: 4.2 meq/L (ref 3.5–5.1)
Sodium: 137 meq/L (ref 135–145)

## 2023-11-05 LAB — HEMOGLOBIN A1C: Hgb A1c MFr Bld: 6.5 % (ref 4.6–6.5)

## 2023-11-05 NOTE — Assessment & Plan Note (Addendum)
 You have a persistent cough that may be related to your lisinopril  medication. -Stop taking lisinopril  for one month to see if your cough improves. -Stop taking Mucinex. -We will reassess your cough in one month.

## 2023-11-05 NOTE — Telephone Encounter (Signed)
 Contacted sports medicine re: scheduling appointment for dislocated shoulder. Dr. Arvell recommended pt go to the ER as she will need to be sedated for shoulder manipulation.  Advised pt to go to the ER for further evaluation and to bring someone with her who can drive her home. She verbalizes understanding.

## 2023-11-05 NOTE — Telephone Encounter (Signed)
 X- ray shows partial shoulder dislocation. I would like to try to get her in to  see sports medicine today.

## 2023-11-05 NOTE — Patient Instructions (Signed)
 VISIT SUMMARY:  Today, you were seen for left shoulder pain after a fall, a persistent cough, and routine management of your diabetes and high blood pressure. We discussed your current medications and made some adjustments to help manage your symptoms.  YOUR PLAN:  LEFT SHOULDER PAIN: You have pain in your left shoulder after a fall, which may be due to a rotator cuff strain or partial tear. -We will order an x-ray of your left shoulder. Please return for imaging during regular business hours. -If the x-ray is negative and the pain continues, we may refer you to a sports medicine specialist.  CHRONIC COUGH: You have a persistent cough that may be related to your lisinopril  medication. -Stop taking lisinopril  for one month to see if your cough improves. -Stop taking Mucinex. -We will reassess your cough in one month.  HYPERTENSION: Your blood pressure is well-controlled, but lisinopril  may be causing your cough. -Stop taking lisinopril  for one month to see if your cough improves. -If your cough does not improve after one month, we will consider other medications for kidney protection.  TYPE 2 DIABETES MELLITUS: Your diabetes is managed with diet, and your recent A1c is 6.5%. -We will order lab tests to monitor your diabetes. -Please schedule a lab appointment for a blood draw. -We discussed the potential benefits of statin therapy, but you have chosen to focus on lifestyle modifications for now.  HYPERLIPIDEMIA: Your LDL cholesterol is 148 mg/dL, and guidelines recommend it be below 70 mg/dL for diabetics. -We will monitor your cholesterol levels. -Continue with dietary and exercise modifications.  GENERAL HEALTH MAINTENANCE: You are a candidate for the pneumonia vaccine due to your age and diabetes. -We administered the pneumonia vaccine today.  FOLLOW-UP: We need to review your progress and test results. -Schedule a follow-up appointment in three months for medication review and  diabetes management. -We will review your x-ray results and contact you with the findings. -Please contact us  if your shoulder pain persists or worsens.

## 2023-11-05 NOTE — Assessment & Plan Note (Signed)
 New. ? Rotator cuff strain. Will obtain X-ray to rule our fracture. If pain persists plan referral to ortho/sports med.

## 2023-11-05 NOTE — Addendum Note (Signed)
 Addended by: DARYL SETTER on: 11/05/2023 08:10 AM   Modules accepted: Level of Service

## 2023-11-06 ENCOUNTER — Encounter (HOSPITAL_BASED_OUTPATIENT_CLINIC_OR_DEPARTMENT_OTHER): Payer: Self-pay | Admitting: Emergency Medicine

## 2023-11-06 ENCOUNTER — Emergency Department (HOSPITAL_BASED_OUTPATIENT_CLINIC_OR_DEPARTMENT_OTHER)

## 2023-11-06 ENCOUNTER — Other Ambulatory Visit: Payer: Self-pay

## 2023-11-06 ENCOUNTER — Emergency Department (HOSPITAL_BASED_OUTPATIENT_CLINIC_OR_DEPARTMENT_OTHER)
Admission: EM | Admit: 2023-11-06 | Discharge: 2023-11-06 | Disposition: A | Discharge status: Home / Self Care | Source: Ambulatory Visit | Attending: Emergency Medicine | Admitting: Emergency Medicine

## 2023-11-06 DIAGNOSIS — Z471 Aftercare following joint replacement surgery: Secondary | ICD-10-CM | POA: Diagnosis not present

## 2023-11-06 DIAGNOSIS — W19XXXA Unspecified fall, initial encounter: Secondary | ICD-10-CM | POA: Insufficient documentation

## 2023-11-06 DIAGNOSIS — S43005A Unspecified dislocation of left shoulder joint, initial encounter: Secondary | ICD-10-CM | POA: Diagnosis not present

## 2023-11-06 DIAGNOSIS — S43015A Anterior dislocation of left humerus, initial encounter: Secondary | ICD-10-CM | POA: Diagnosis not present

## 2023-11-06 MED ORDER — FENTANYL CITRATE PF 50 MCG/ML IJ SOSY: 100.0000 ug | PREFILLED_SYRINGE | Freq: Once | INTRAMUSCULAR | Status: DC

## 2023-11-06 MED ORDER — ONDANSETRON HCL 4 MG/2ML IJ SOLN: 4.0000 mg | Freq: Once | INTRAMUSCULAR | Status: DC

## 2023-11-06 NOTE — ED Provider Notes (Signed)
 Patient with a left shoulder dislocation that has been out for about 1 week.  Neurovascularly intact.  Sent here for dislocation reduction.  Has a clear shoulder deformity.  Discussed that there is some risk in either failure to reduce or potentially nerve/artery injury the longer the shoulder is out.  However it has been out for only a week and not 4 weeks, which is typically considered a contraindication.  Thus she prefers to go ahead and reduce, knowing there is some risk.  Reduction was unremarkable and successful as below.  .Reduction of dislocation  Date/Time: 11/06/2023 2:29 PM  Performed by: Freddi Hamilton, MD Authorized by: Freddi Hamilton, MD  Consent: Verbal consent obtained Risks and benefits: risks, benefits and alternatives were discussed Consent given by: patient Patient identity confirmed: verbally with patient Preparation: Patient was prepped and draped in the usual sterile fashion. Local anesthesia used: no  Anesthesia: Local anesthesia used: no  Sedation: Patient sedated: no  Patient tolerance: patient tolerated the procedure well with no immediate complications Comments: FARES technique with minimal humeral head manipulation at the end reduced the shoulder. Normal neurovascular testing after the procedure       Freddi Hamilton, MD 11/06/23 1456

## 2023-11-06 NOTE — ED Provider Notes (Signed)
 [place] [facility] AT [facility] Provider Note   CSN: [id number] Arrival date & time: 11/06/23  1333     Patient presents with: Shoulder Injury   Victoria Mejia is a 53 y.o. female.    Patient presents to the emergency department today for evaluation of left shoulder injury.  Patient had a fall 1 week ago.  She had x-ray performed showing anterior dislocation.  Patient has been moving her shoulder around daily.  She has some discomfort but not significant pain.  No distal numbness or tingling.  She denies other injuries, such as hitting her head.       Prior to Admission medications   Medication Sig Start Date End Date Taking? Authorizing Provider  ALYACEN 1/35 tablet TAKE 1 TABLET BY MOUTH EVERY DAY 09/24/23   Rudy Carlin LABOR, MD  amLODipine  (NORVASC ) 10 MG tablet TAKE 1 TABLET BY MOUTH EVERY DAY 07/17/23   Daryl Setter, NP  ibuprofen  (ADVIL ) 800 MG tablet Take 1 tablet (800 mg total) by mouth every 8 (eight) hours as needed. 10/11/19   Rudy Carlin LABOR, MD  lisinopril  (ZESTRIL ) 2.5 MG tablet Take 1 tablet (2.5 mg total) by mouth daily. 05/26/23   O'Sullivan, Melissa, NP  loratadine (CLARITIN) 10 MG tablet Take by mouth. 10/04/20   [provider]    Allergies: Patient has no known allergies.    Review of Systems  Updated Vital Signs BP (!) 148/86 (BP Location: Right Arm)   Pulse (!) 123   Temp 98.8 F (37.1 C) (Oral)   Resp 16   Ht 5' 4 (1.626 m)   Wt 84.7 kg   LMP 11/04/2023   SpO2 100%   BMI 32.06 kg/m   Physical Exam Vitals and nursing note reviewed.  Constitutional:      Appearance: She is well-developed.  HENT:     Head: Normocephalic and atraumatic.  Eyes:     Pupils: Pupils are equal, round, and reactive to light.  Cardiovascular:     Pulses: Normal pulses. No decreased pulses.          Radial pulses are 2+ on the left side.  Musculoskeletal:        General: Tenderness present.     Left shoulder:  Swelling and tenderness present. No bony tenderness. Decreased range of motion.     Left upper arm: No tenderness or bony tenderness.     Left elbow: Normal range of motion. No tenderness.     Left wrist: No tenderness. Normal range of motion.     Cervical back: Normal range of motion and neck supple.  Skin:    General: Skin is warm and dry.  Neurological:     Mental Status: She is alert.     Sensory: No sensory deficit.     Comments: Motor, sensation, and vascular distal to the injury is fully intact.   Psychiatric:        Mood and Affect: Mood normal.     (all labs ordered are listed, but only abnormal results are displayed) Labs Reviewed - No data to display  EKG: None  Radiology: DG Shoulder Left Result Date: 11/05/2023 CLINICAL DATA:  Pain after fall EXAM: LEFT SHOULDER - 3 VIEW COMPARISON:  None Available. FINDINGS: Anterior shoulder dislocation. The humeral head is anterior and inferior to the glenoid. Preserved AC joint. Tiny density seen on one view adjacent to the coracoid process. Small fracture is possible. Recommend follow up imaging after relocation. IMPRESSION: Anterior inferior shoulder  dislocation. Question tiny fracture fragment adjacent to the coracoid process on one view. Recommend follow up imaging after reduction of the dislocation. Electronically Signed   By: Ranell Bring M.D.   On: 11/05/2023 11:20     Procedures   Medications Ordered in the ED  ondansetron  (ZOFRAN ) injection 4 mg (has no administration in time range)  fentaNYL  (SUBLIMAZE ) injection 100 mcg (has no administration in time range)   ED Course  Patient seen and examined. History obtained directly from patient.  Reviewed outpatient notes.  Reviewed x-ray performed as an outpatient.  Labs/EKG: None ordered  Imaging: None ordered  Medications/Fluids: Ordered: Fentanyl /Zofran   Most recent vital signs reviewed and are as follows: BP (!) 148/86 (BP Location: Right Arm)   Pulse (!) 123    Temp 98.8 F (37.1 C) (Oral)   Resp 16   Ht 5' 4 (1.626 m)   Wt 84.7 kg   LMP 11/04/2023   SpO2 100%   BMI 32.06 kg/m   Initial impression: Left shoulder dislocation  Dr. Freddi was able to reduce dislocation using FARES technique. No medications were given. Pt tolerated well. Post-reduction film ordered.   4:22 PM Reassessment performed. Patient appears stable.  Pulse 2+.  Distal circulation, motor, sensation intact.  Imaging personally visualized and interpreted including: Interval reduction.  Reviewed pertinent lab work and imaging with patient at bedside. Questions answered.   Most current vital signs reviewed and are as follows: BP 120/82   Pulse 92   Temp 98.8 F (37.1 C) (Oral)   Resp 16   Ht 5' 4 (1.626 m)   Wt 84.7 kg   LMP 11/04/2023   SpO2 97%   BMI 32.06 kg/m   Plan: Discharge to home.   Prescriptions written for: None  Other home care instructions discussed: Use of sling, no reaching right upper extremity.  ED return instructions discussed: New or worsening symptoms.  Follow-up instructions discussed: Patient encouraged to follow-up with orthopedics.                                   Medical Decision Making Amount and/or Complexity of Data Reviewed Radiology: ordered.  Risk Prescription drug management.   Left shoulder dislocation occurring 1 week ago.  Upper extremity is neurovascularly intact.  Shoulder was reduced at bedside.  Outpatient referral given.     Final diagnoses:  Dislocation of left shoulder joint, initial encounter    ED Discharge Orders     None          Desiderio Chew, PA-C 11/06/23 1625    Freddi Hamilton, MD 11/17/23 0700

## 2023-11-06 NOTE — Discharge Instructions (Signed)
 Please read and follow all provided instructions.  Your diagnoses today include:  1. Dislocation of left shoulder joint, initial encounter     Tests performed today include: An x-ray of the affected area - shows shoulder is back in place Vital signs. See below for your results today.   Medications prescribed:  Please use over-the-counter NSAID medications (ibuprofen , naproxen) or Tylenol  (acetaminophen ) as directed on the packaging for pain -- as long as you do not have any reasons avoid these medications. Reasons to avoid NSAID medications include: weak kidneys, a history of bleeding in your stomach or gut, or uncontrolled high blood pressure or previous heart attack. Reasons to avoid Tylenol  include: liver problems or ongoing alcohol use. Never take more than 4000mg  or 8 Extra strength Tylenol  in a 24 hour period.     Take any prescribed medications only as directed.  Home care instructions:  Follow any educational materials contained in this packet Use sling until cleared by orthopedic provider Follow R.I.C.E. Protocol: R - rest your injury  I  - use ice on injury without applying directly to skin C - compress injury with bandage or splint E - elevate the injury as much as possible  Follow-up instructions: Please follow-up with the provided orthopedic physician (bone specialist) in 1 week.   Return instructions:  Please return if your fingers are numb or tingling, appear gray or blue, or you have severe pain (also elevate the arm and loosen splint or wrap if you were given one) Please return to the Emergency Department if you experience worsening symptoms.  Please return if you have any other emergent concerns.  Additional Information:  Your vital signs today were: BP (!) 148/86 (BP Location: Right Arm)   Pulse (!) 123   Temp 98.8 F (37.1 C) (Oral)   Resp 16   Ht 5' 4 (1.626 m)   Wt 84.7 kg   LMP 11/04/2023   SpO2 100%   BMI 32.06 kg/m  If your blood pressure (BP)  was elevated above 135/85 this visit, please have this repeated by your doctor within one month. --------------

## 2023-11-06 NOTE — ED Triage Notes (Signed)
 Pt POV- sent here by orthopedist due to imaging showing dislocation of  L shoulder. Pt reports she had fall on Thursday.

## 2023-11-11 ENCOUNTER — Ambulatory Visit: Admitting: Family

## 2023-11-11 DIAGNOSIS — F411 Generalized anxiety disorder: Secondary | ICD-10-CM | POA: Diagnosis not present

## 2023-11-11 DIAGNOSIS — F331 Major depressive disorder, recurrent, moderate: Secondary | ICD-10-CM | POA: Diagnosis not present

## 2023-11-12 DIAGNOSIS — S43005A Unspecified dislocation of left shoulder joint, initial encounter: Secondary | ICD-10-CM | POA: Diagnosis not present

## 2023-11-17 ENCOUNTER — Encounter: Payer: Self-pay | Admitting: Family

## 2023-11-17 DIAGNOSIS — M25512 Pain in left shoulder: Secondary | ICD-10-CM | POA: Diagnosis not present

## 2023-11-20 DIAGNOSIS — F331 Major depressive disorder, recurrent, moderate: Secondary | ICD-10-CM | POA: Diagnosis not present

## 2023-11-20 DIAGNOSIS — F411 Generalized anxiety disorder: Secondary | ICD-10-CM | POA: Diagnosis not present

## 2023-11-20 DIAGNOSIS — Z0279 Encounter for issue of other medical certificate: Secondary | ICD-10-CM

## 2023-11-26 ENCOUNTER — Telehealth: Payer: Self-pay | Admitting: Family

## 2023-11-26 NOTE — Telephone Encounter (Signed)
 Pt dropped off papers for pcp to fill out. Placed papers in pcps box. Please call pt when ready to be picked up.

## 2023-11-27 NOTE — Telephone Encounter (Signed)
 Form taken to the back for provider

## 2023-12-01 DIAGNOSIS — F411 Generalized anxiety disorder: Secondary | ICD-10-CM | POA: Diagnosis not present

## 2023-12-01 DIAGNOSIS — F331 Major depressive disorder, recurrent, moderate: Secondary | ICD-10-CM | POA: Diagnosis not present

## 2023-12-03 DIAGNOSIS — S43005D Unspecified dislocation of left shoulder joint, subsequent encounter: Secondary | ICD-10-CM | POA: Diagnosis not present

## 2023-12-05 ENCOUNTER — Other Ambulatory Visit: Payer: Self-pay | Admitting: Family

## 2023-12-05 DIAGNOSIS — R809 Proteinuria, unspecified: Secondary | ICD-10-CM

## 2023-12-12 DIAGNOSIS — F411 Generalized anxiety disorder: Secondary | ICD-10-CM | POA: Diagnosis not present

## 2023-12-12 DIAGNOSIS — F331 Major depressive disorder, recurrent, moderate: Secondary | ICD-10-CM | POA: Diagnosis not present

## 2023-12-18 DIAGNOSIS — F411 Generalized anxiety disorder: Secondary | ICD-10-CM | POA: Diagnosis not present

## 2023-12-18 DIAGNOSIS — F331 Major depressive disorder, recurrent, moderate: Secondary | ICD-10-CM | POA: Diagnosis not present

## 2023-12-19 ENCOUNTER — Other Ambulatory Visit: Payer: Self-pay | Admitting: Family

## 2023-12-31 DIAGNOSIS — F331 Major depressive disorder, recurrent, moderate: Secondary | ICD-10-CM | POA: Diagnosis not present

## 2023-12-31 DIAGNOSIS — F411 Generalized anxiety disorder: Secondary | ICD-10-CM | POA: Diagnosis not present

## 2023-12-31 DIAGNOSIS — S43005D Unspecified dislocation of left shoulder joint, subsequent encounter: Secondary | ICD-10-CM | POA: Diagnosis not present

## 2024-01-06 DIAGNOSIS — F331 Major depressive disorder, recurrent, moderate: Secondary | ICD-10-CM | POA: Diagnosis not present

## 2024-01-06 DIAGNOSIS — F411 Generalized anxiety disorder: Secondary | ICD-10-CM | POA: Diagnosis not present

## 2024-01-19 DIAGNOSIS — F411 Generalized anxiety disorder: Secondary | ICD-10-CM | POA: Diagnosis not present

## 2024-01-19 DIAGNOSIS — F331 Major depressive disorder, recurrent, moderate: Secondary | ICD-10-CM | POA: Diagnosis not present

## 2024-01-23 ENCOUNTER — Ambulatory Visit: Admitting: Family

## 2024-01-27 ENCOUNTER — Ambulatory Visit: Admitting: Family

## 2024-01-27 VITALS — BP 132/78 | HR 84 | Temp 98.8°F | Resp 16 | Ht 64.0 in | Wt 194.0 lb

## 2024-01-27 DIAGNOSIS — E785 Hyperlipidemia, unspecified: Secondary | ICD-10-CM

## 2024-01-27 DIAGNOSIS — E118 Type 2 diabetes mellitus with unspecified complications: Secondary | ICD-10-CM

## 2024-01-27 DIAGNOSIS — I10 Essential (primary) hypertension: Secondary | ICD-10-CM | POA: Diagnosis not present

## 2024-01-27 NOTE — Progress Notes (Unsigned)
 Subjective:     Patient ID: Victoria Mejia, female    DOB: 1970-05-27, 53 y.o.   MRN: [id number]  Chief Complaint  Patient presents with   Hypertension    Here for follow up    HPI  Discussed the use of AI scribe software for clinical note transcription with the patient, who gave verbal consent to proceed.  History of Present Illness  Victoria Mejia is a 53 year old female with hypertension and proteinuria who presents for medication management.  She manages her hypertension with amlodipine  10 mg but experiences swelling in her feet and ankles, which she attributes to the medication. She previously had a cough with lisinopril , which resolved after discontinuation. She is not currently taking lisinopril . She has been offered vaccinations for flu, shingles, and hepatitis B, declining the flu shot today and considering the others for future appointments.     Health Maintenance Due  Topic Date Due   Hepatitis B Vaccines 19-59 Average Risk (1 of 3 - 19+ 3-dose series) Never done   Zoster Vaccines- Shingrix (1 of 2) Never done   Influenza Vaccine  12/05/2023   COVID-19 Vaccine (4 - 2025-26 season) 01/05/2024   OPHTHALMOLOGY EXAM  12/31/2023    Past Medical History:  Diagnosis Date   History of chicken pox    Hyperlipidemia    Hypertension     Past Surgical History:  Procedure Laterality Date   CESAREAN SECTION  2005   HYSTEROSCOPY N/A 01/25/2015   Procedure: HYSTEROSCOPY;  Surgeon: Carlin DELENA Centers, MD;  Location: [facility];  Service: Gynecology;  Laterality: N/A;   IUD REMOVAL N/A 01/25/2015   Procedure: INTRAUTERINE DEVICE (IUD) REMOVAL;  Surgeon: Carlin DELENA Centers, MD;  Location: [facility];  Service: Gynecology;  Laterality: N/A;    Family History  Problem Relation Age of Onset   Hypertension Father    Alcohol abuse Father    Cirrhosis Father    Stroke Maternal Grandmother    Arthritis Mother    Hyperlipidemia Mother    Heart disease Maternal Uncle 62     Social History   Socioeconomic History   Marital status: Single    Spouse name: Not on file   Number of children: Not on file   Years of education: Not on file   Highest education level: Bachelor's degree (e.g., BA, AB, BS)  Occupational History   Not on file  Tobacco Use   Smoking status: Never   Smokeless tobacco: Never  Substance and Sexual Activity   Alcohol use: Yes    Alcohol/week: 1.0 - 2.0 standard drink of alcohol    Types: 1 - 2 Glasses of wine per week    Comment: occasional   Drug use: No   Sexual activity: Yes    Partners: Male    Birth control/protection: Pill  Other Topics Concern   Not on file  Social History Narrative   Single, lives with her daughter   Daughter 2005    Works in [place] for [employer].    Completed college   No pets   Enjoys television   Social Drivers of Corporate investment banker Strain: Low Risk  (11/02/2023)   Overall Financial Resource Strain (CARDIA)    Difficulty of Paying Living Expenses: Not hard at all  Food Insecurity: No Food Insecurity (11/02/2023)   Hunger Vital Sign    Worried About Running Out of Food in the Last Year: Never true    Ran Out of Food in  the Last Year: Never true  Transportation Needs: No Transportation Needs (11/02/2023)   PRAPARE - Administrator, Civil Service (Medical): No    Lack of Transportation (Non-Medical): No  Physical Activity: Inactive (11/02/2023)   Exercise Vital Sign    Days of Exercise per Week: 0 days    Minutes of Exercise per Session: Not on file  Stress: No Stress Concern Present (11/02/2023)   [employer] of Occupational Health - Occupational Stress Questionnaire    Feeling of Stress: Only a little  Social Connections: Moderately Isolated (11/02/2023)   Social Connection and Isolation Panel    Frequency of Communication with Friends and Family: Three times a week    Frequency of Social Gatherings with Friends and Family: Once a week    Attends Religious  Services: 1 to 4 times per year    Active Member of [employer] or Organizations: No    Attends Engineer, structural: Not on file    Marital Status: Never married  Intimate Partner Violence: Not on file    Outpatient Medications Prior to Visit  Medication Sig Dispense Refill   ALYACEN 1/35 tablet TAKE 1 TABLET BY MOUTH EVERY DAY 84 tablet 3   amLODipine  (NORVASC ) 10 MG tablet Take 1 tablet (10 mg total) by mouth daily. 90 tablet 0   ibuprofen  (ADVIL ) 800 MG tablet Take 1 tablet (800 mg total) by mouth every 8 (eight) hours as needed. 30 tablet 5   lisinopril  (ZESTRIL ) 2.5 MG tablet TAKE 1 TABLET(2.5 MG) BY MOUTH DAILY 90 tablet 1   loratadine (CLARITIN) 10 MG tablet Take by mouth.     No facility-administered medications prior to visit.    No Known Allergies  ROS See HPI    Objective:    Physical Exam Constitutional:      General: She is not in acute distress.    Appearance: Normal appearance. She is well-developed.  HENT:     Head: Normocephalic and atraumatic.     Right Ear: External ear normal.     Left Ear: External ear normal.  Eyes:     General: No scleral icterus. Neck:     Thyroid : No thyromegaly.  Cardiovascular:     Rate and Rhythm: Normal rate and regular rhythm.     Heart sounds: Normal heart sounds. No murmur heard. Pulmonary:     Effort: Pulmonary effort is normal. No respiratory distress.     Breath sounds: Normal breath sounds. No wheezing.  Musculoskeletal:     Cervical back: Neck supple.  Skin:    General: Skin is warm and dry.  Neurological:     Mental Status: She is alert and oriented to person, place, and time.  Psychiatric:        Mood and Affect: Mood normal.        Behavior: Behavior normal.        Thought Content: Thought content normal.        Judgment: Judgment normal.      BP 132/78 (BP Location: Right Arm, Patient Position: Sitting, Cuff Size: Normal)   Pulse 84   Temp 98.8 F (37.1 C) (Oral)   Resp 16   Ht 5' 4 (1.626 m)    Wt 194 lb (88 kg)   SpO2 99%   BMI 33.30 kg/m  Wt Readings from Last 3 Encounters:  01/27/24 194 lb (88 kg)  11/06/23 186 lb 12.8 oz (84.7 kg)  11/04/23 186 lb 12.8 oz (84.7 kg)  Assessment & Plan:   Problem List Items Addressed This Visit       Unprioritized   Type 2 diabetes with complication (HCC) - Primary    Diabetic proteinuria indicates kidney damage. Lisinopril  discontinued due to cough. Discussed losartan and Farxiga for kidney protection. Losartan preferred due to lower cough risk. Farxiga promising but she is apprehensive. - Consider losartan at a low dose to prevent kidney damage and assess tolerance. - Discuss Doreen for kidney protection and diabetes management. She wishes to think about it.       Relevant Orders   Basic Metabolic Panel (BMET)   Comp Met (CMET)   HgB A1c   Hyperlipidemia   Update lipid panel.      Relevant Orders   Lipid panel   HTN (hypertension)    Hypertension managed with amlodipine  10 mg. Reports peripheral edema, a side effect. Losartan may manage blood pressure and reduce edema, allowing amlodipine  reduction. - Consider reducing amlodipine  to 5 mg and adding losartan to manage blood pressure and reduce edema.      General Health Maintenance Discussed flu, shingles, and hepatitis B vaccines. She declined flu shot, considering shingles and hepatitis B. Emphasized shingles vaccine importance. - Recommend scheduling shingles vaccine, preferably on a Friday. - Discuss hepatitis B vaccination at next visit.   I am having Harlene HERO. Jude maintain her ibuprofen , loratadine, ALYACEN 1/35, lisinopril , and amLODipine .  No orders of the defined types were placed in this encounter.

## 2024-01-29 ENCOUNTER — Telehealth: Payer: Self-pay | Admitting: Family

## 2024-01-29 NOTE — Assessment & Plan Note (Addendum)
 Lab Results  Component Value Date   HGBA1C 6.5 11/05/2023    Diabetic proteinuria indicates kidney damage. Lisinopril  discontinued due to cough. Discussed losartan and Farxiga for kidney protection. Losartan preferred due to lower cough risk. Farxiga promising but she is apprehensive. - Consider losartan at a low dose to prevent kidney damage and assess tolerance. - Discuss Victoria Mejia for kidney protection and diabetes management. She wishes to think about it.

## 2024-01-29 NOTE — Assessment & Plan Note (Signed)
  Hypertension managed with amlodipine  10 mg. Reports peripheral edema, a side effect. Losartan may manage blood pressure and reduce edema, allowing amlodipine  reduction. - Consider reducing amlodipine  to 5 mg and adding losartan to manage blood pressure and reduce edema.

## 2024-01-29 NOTE — Telephone Encounter (Signed)
Please contact pt to schedule lab appointment.

## 2024-01-29 NOTE — Assessment & Plan Note (Signed)
 Update lipid panel.

## 2024-01-29 NOTE — Telephone Encounter (Signed)
Called and got her scheduled

## 2024-01-29 NOTE — Patient Instructions (Signed)
 VISIT SUMMARY:  Today, we discussed managing your hypertension and proteinuria, as well as potential adjustments to your medications to address side effects. We also talked about vaccinations for flu, shingles, and hepatitis B.  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 -Consider starting losartan at a low dose to protect your kidneys and see how you tolerate it. -We also discussed Doreen for kidney protection and diabetes management, but you are hesitant.  HYPERTENSION: Your high blood pressure is currently managed with amlodipine , but it causes swelling in your feet and ankles. -Consider reducing amlodipine  to 5 mg and adding losartan to help manage your blood pressure and reduce swelling.  PERIPHERAL EDEMA DUE TO AMLODIPINE : The swelling in your feet and ankles is likely due to your current medication, amlodipine . -Consider reducing amlodipine  to 5 mg and adding losartan to help manage your blood pressure and reduce swelling.  GENERAL HEALTH MAINTENANCE: We discussed vaccinations to keep you healthy. -You declined the flu shot today but are considering the shingles and hepatitis B vaccines for future appointments. -I recommend scheduling the shingles vaccine, preferably on a Friday. -We will discuss the hepatitis B vaccination at your next visit.  FOLLOW-UP: We need to reassess your conditions and review your medication and vaccination decisions. -Schedule a follow-up appointment in three months. -We will order lab work including A1c and kidney function tests before your next visit.

## 2024-01-30 ENCOUNTER — Other Ambulatory Visit

## 2024-02-02 ENCOUNTER — Other Ambulatory Visit

## 2024-02-02 DIAGNOSIS — E785 Hyperlipidemia, unspecified: Secondary | ICD-10-CM | POA: Diagnosis not present

## 2024-02-02 DIAGNOSIS — E118 Type 2 diabetes mellitus with unspecified complications: Secondary | ICD-10-CM

## 2024-02-02 LAB — COMPREHENSIVE METABOLIC PANEL WITH GFR
ALT: 12 U/L (ref 0–35)
AST: 13 U/L (ref 0–37)
Albumin: 4.2 g/dL (ref 3.5–5.2)
Alkaline Phosphatase: 71 U/L (ref 39–117)
BUN: 5 mg/dL — ABNORMAL LOW (ref 6–23)
CO2: 26 meq/L (ref 19–32)
Calcium: 9.5 mg/dL (ref 8.4–10.5)
Chloride: 105 meq/L (ref 96–112)
Creatinine, Ser: 0.52 mg/dL (ref 0.40–1.20)
GFR: 107.52 mL/min (ref >60.00)
Glucose, Bld: 118 mg/dL — ABNORMAL HIGH (ref 70–99)
Potassium: 4.5 meq/L (ref 3.5–5.1)
Sodium: 139 meq/L (ref 135–145)
Total Bilirubin: 0.6 mg/dL (ref 0.2–1.2)
Total Protein: 6.7 g/dL (ref 6.0–8.3)

## 2024-02-02 LAB — LIPID PANEL
Cholesterol: 229 mg/dL — ABNORMAL HIGH (ref 0–200)
HDL: 49.6 mg/dL (ref >39.00)
LDL Cholesterol: 159 mg/dL — ABNORMAL HIGH (ref 0–99)
NonHDL: 179.15
Total CHOL/HDL Ratio: 5
Triglycerides: 100 mg/dL (ref 0.0–149.0)
VLDL: 20 mg/dL (ref 0.0–40.0)

## 2024-02-02 LAB — HEMOGLOBIN A1C: Hgb A1c MFr Bld: 6.9 % — ABNORMAL HIGH (ref 4.6–6.5)

## 2024-02-04 ENCOUNTER — Ambulatory Visit: Payer: Self-pay | Admitting: Family

## 2024-02-04 DIAGNOSIS — E785 Hyperlipidemia, unspecified: Secondary | ICD-10-CM

## 2024-02-04 NOTE — Telephone Encounter (Signed)
 A1C is up at 6.9.  LDL bad cholesterol is 159, goal is <70.  I would recommend that she add atorvastatin 20mg  once daily to help her cholesterol and decrease chance of stroke and heart attack.   Please continue to work on healthy diet and exercise.

## 2024-02-06 MED ORDER — ATORVASTATIN CALCIUM 20 MG PO TABS: 20.0000 mg | ORAL_TABLET | Freq: Every day | ORAL | 1 refills | Status: DC

## 2024-02-06 NOTE — Telephone Encounter (Signed)
 Patient was notified of results, new prescription  and recommendation from provider. She verbalizes agreement and understanding.

## 2024-03-15 ENCOUNTER — Encounter: Payer: Self-pay | Admitting: Obstetrics

## 2024-03-15 ENCOUNTER — Ambulatory Visit (INDEPENDENT_AMBULATORY_CARE_PROVIDER_SITE_OTHER): Admitting: Obstetrics

## 2024-03-15 ENCOUNTER — Other Ambulatory Visit (HOSPITAL_COMMUNITY)
Admission: RE | Admit: 2024-03-15 | Discharge: 2024-03-15 | Disposition: A | Discharge status: Home / Self Care | Source: Ambulatory Visit | Attending: Obstetrics | Admitting: Obstetrics

## 2024-03-15 VITALS — BP 127/85 | HR 94 | Ht 64.0 in | Wt 194.2 lb

## 2024-03-15 DIAGNOSIS — Z1151 Encounter for screening for human papillomavirus (HPV): Secondary | ICD-10-CM | POA: Insufficient documentation

## 2024-03-15 DIAGNOSIS — R52 Pain, unspecified: Secondary | ICD-10-CM | POA: Diagnosis not present

## 2024-03-15 DIAGNOSIS — Z01419 Encounter for gynecological examination (general) (routine) without abnormal findings: Secondary | ICD-10-CM

## 2024-03-15 DIAGNOSIS — Z3041 Encounter for surveillance of contraceptive pills: Secondary | ICD-10-CM

## 2024-03-15 MED ORDER — IBUPROFEN 800 MG PO TABS
800.0000 mg | ORAL_TABLET | Freq: Three times a day (TID) | ORAL | 5 refills | Status: AC | PRN
Start: 2024-03-15 — End: ?

## 2024-03-15 MED ORDER — IBUPROFEN 800 MG PO TABS: 800.0000 mg | ORAL_TABLET | Freq: Three times a day (TID) | ORAL | 5 refills | Status: DC | PRN

## 2024-03-15 NOTE — Progress Notes (Signed)
 Subjective:        Victoria Mejia is a 53 y.o. female here for a routine exam.  Current complaints: None.    Personal health questionnaire:  Is patient Ashkenazi Jewish, have a family history of breast and/or ovarian cancer: no Is there a family history of uterine cancer diagnosed at age < 24, gastrointestinal cancer, urinary tract cancer, family member who is a Personnel Officer syndrome-associated carrier: no Is the patient overweight and hypertensive, family history of diabetes, personal history of gestational diabetes, preeclampsia or PCOS: no Is patient over 44, have PCOS,  family history of premature CHD under age 58, diabetes, smoke, have hypertension or peripheral artery disease:  no At any time, has a partner hit, kicked or otherwise hurt or frightened you?: no Over the past 2 weeks, have you felt down, depressed or hopeless?: no Over the past 2 weeks, have you felt little interest or pleasure in doing things?:no   Gynecologic History Patient's last menstrual period was 03/06/2024 (approximate). Contraception: OCP (estrogen/progesterone) Last Pap: 2022. Results were: normal Last mammogram: 2024. Results were: normal  Obstetric History OB History  Gravida Para Term Preterm AB Living  2    1 1   SAB IAB Ectopic Multiple Live Births  1    1    # Outcome Date GA Lbr Len/2nd Weight Sex Type Anes PTL Lv  2 Gravida 09/04/03    F CS-LTranv EPI  LIV  1 SAB 2004            Past Medical History:  Diagnosis Date   History of chicken pox    Hyperlipidemia    Hypertension     Past Surgical History:  Procedure Laterality Date   CESAREAN SECTION  2005   HYSTEROSCOPY N/A 01/25/2015   Procedure: HYSTEROSCOPY;  Surgeon: Carlin DELENA Centers, MD;  Location: [facility];  Service: Gynecology;  Laterality: N/A;   IUD REMOVAL N/A 01/25/2015   Procedure: INTRAUTERINE DEVICE (IUD) REMOVAL;  Surgeon: Carlin DELENA Centers, MD;  Location: [facility];  Service: Gynecology;  Laterality: N/A;     Current  Outpatient Medications:    ALYACEN 1/35 tablet, TAKE 1 TABLET BY MOUTH EVERY DAY, Disp: 84 tablet, Rfl: 3   amLODipine  (NORVASC ) 10 MG tablet, Take 1 tablet (10 mg total) by mouth daily., Disp: 90 tablet, Rfl: 0   atorvastatin (LIPITOR) 20 MG tablet, Take 1 tablet (20 mg total) by mouth daily., Disp: 90 tablet, Rfl: 1   ibuprofen  (ADVIL ) 800 MG tablet, Take 1 tablet (800 mg total) by mouth every 8 (eight) hours as needed., Disp: 30 tablet, Rfl: 5   loratadine (CLARITIN) 10 MG tablet, Take by mouth., Disp: , Rfl:    ibuprofen  (ADVIL ) 800 MG tablet, Take 1 tablet (800 mg total) by mouth every 8 (eight) hours as needed., Disp: 30 tablet, Rfl: 5   lisinopril  (ZESTRIL ) 2.5 MG tablet, TAKE 1 TABLET(2.5 MG) BY MOUTH DAILY, Disp: 90 tablet, Rfl: 1 No Known Allergies  Social History   Tobacco Use   Smoking status: Never   Smokeless tobacco: Never  Substance Use Topics   Alcohol use: Yes    Alcohol/week: 1.0 - 2.0 standard drink of alcohol    Types: 1 - 2 Glasses of wine per week    Comment: occasional    Family History  Problem Relation Age of Onset   Hypertension Father    Alcohol abuse Father    Cirrhosis Father    Stroke Maternal Grandmother    Arthritis Mother  Hyperlipidemia Mother    Heart disease Maternal Uncle 50      Review of Systems  Constitutional: negative for fatigue and weight loss Respiratory: negative for cough and wheezing Cardiovascular: negative for chest pain, fatigue and palpitations Gastrointestinal: negative for abdominal pain and change in bowel habits Musculoskeletal:negative for myalgias Neurological: negative for gait problems and tremors Behavioral/Psych: negative for abusive relationship, depression Endocrine: negative for temperature intolerance    Genitourinary:negative for abnormal menstrual periods, genital lesions, hot flashes, sexual problems and vaginal discharge Integument/breast: negative for breast lump, breast tenderness, nipple discharge  and skin lesion(s)    Objective:       BP 127/85   Pulse 94   Ht 5' 4 (1.626 m)   Wt 194 lb 3.2 oz (88.1 kg)   LMP 03/06/2024 (Approximate)   BMI 33.33 kg/m  General:   Alert and no distress  Skin:   no rash or abnormalities  Lungs:   clear to auscultation bilaterally  Heart:   regular rate and rhythm, S1, S2 normal, no murmur, click, rub or gallop  Breasts:   normal without suspicious masses, skin or nipple changes or axillary nodes  Abdomen:  normal findings: no organomegaly, soft, non-tender and no hernia  Pelvis:  External genitalia: normal general appearance Urinary system: urethral meatus normal and bladder without fullness, nontender Vaginal: normal without tenderness, induration or masses Cervix: normal appearance Adnexa: normal bimanual exam Uterus: anteverted and non-tender, normal size   Lab Review Urine pregnancy test Labs reviewed yes Radiologic studies reviewed yes  I have spent a total of 20 minutes of face-to-face time, excluding clinical staff time, reviewing notes and preparing to see patient, ordering tests and/or medications, and counseling the patient.   Assessment:    1. Encounter for gynecological examination with Papanicolaou smear of cervix (Primary) Rx: - Cytology - PAP( [facility])  2. Pain aggravated by activities of daily living Rx: - ibuprofen  (ADVIL ) 800 MG tablet; Take 1 tablet (800 mg total) by mouth every 8 (eight) hours as needed.  Dispense: 30 tablet; Refill: 5  3. Encounter for surveillance of contraceptive pills - doing well     Plan:    Education reviewed: calcium supplements, depression evaluation, low fat, low cholesterol diet, safe sex/STD prevention, self breast exams, and weight bearing exercise. Contraception: OCP (estrogen/progesterone). Follow up in: 3 years.   Meds ordered this encounter  Medications   DISCONTD: ibuprofen  (ADVIL ) 800 MG tablet    Sig: Take 1 tablet (800 mg total) by mouth every 8 (eight) hours  as needed.    Dispense:  30 tablet    Refill:  5   ibuprofen  (ADVIL ) 800 MG tablet    Sig: Take 1 tablet (800 mg total) by mouth every 8 (eight) hours as needed.    Dispense:  30 tablet    Refill:  5     CARLIN RONAL CENTERS, MD, FACOG Attending Obstetrician & Gynecologist, [facility] for [facility], [facility], [place] 03/15/2024

## 2024-03-15 NOTE — Progress Notes (Signed)
 Pt presents for annual. Pt has no questions or concerns at this time. Pt mamm is due on 04/2024

## 2024-03-16 DIAGNOSIS — H35033 Hypertensive retinopathy, bilateral: Secondary | ICD-10-CM | POA: Diagnosis not present

## 2024-03-17 LAB — CYTOLOGY - PAP
Adequacy: ABSENT
Comment: NEGATIVE
Diagnosis: NEGATIVE
High risk HPV: NEGATIVE

## 2024-03-28 ENCOUNTER — Other Ambulatory Visit: Payer: Self-pay | Admitting: Family

## 2024-04-20 ENCOUNTER — Ambulatory Visit: Admitting: Family

## 2024-04-20 ENCOUNTER — Telehealth: Payer: Self-pay | Admitting: Family

## 2024-04-20 VITALS — BP 136/74 | HR 104 | Temp 98.4°F | Resp 16 | Ht 64.0 in | Wt 196.8 lb

## 2024-04-20 DIAGNOSIS — Z1231 Encounter for screening mammogram for malignant neoplasm of breast: Secondary | ICD-10-CM

## 2024-04-20 DIAGNOSIS — E118 Type 2 diabetes mellitus with unspecified complications: Secondary | ICD-10-CM | POA: Diagnosis not present

## 2024-04-20 DIAGNOSIS — E785 Hyperlipidemia, unspecified: Secondary | ICD-10-CM

## 2024-04-20 DIAGNOSIS — R059 Cough, unspecified: Secondary | ICD-10-CM | POA: Diagnosis not present

## 2024-04-20 DIAGNOSIS — Z23 Encounter for immunization: Secondary | ICD-10-CM

## 2024-04-20 MED ORDER — DAPAGLIFLOZIN PROPANEDIOL 5 MG PO TABS: 5.0000 mg | ORAL_TABLET | Freq: Every day | ORAL | 2 refills | Status: AC

## 2024-04-20 NOTE — Assessment & Plan Note (Signed)
 Lab Results  Component Value Date   HGBA1C 6.9 (H) 02/02/2024   HGBA1C 6.5 11/05/2023   HGBA1C 6.5 07/22/2023   Lab Results  Component Value Date   MICROALBUR 7.0 (H) 11/05/2023   LDLCALC 159 (H) 02/02/2024   CREATININE 0.52 02/02/2024   Last A1C is 6.9. Had to stop ACE due to cough.  Willing to start farxiga  for renal protection and DM control.

## 2024-04-20 NOTE — Telephone Encounter (Signed)
 Please call [facility] care and request copy of DM eye exam.

## 2024-04-20 NOTE — Assessment & Plan Note (Addendum)
 Lab Results  Component Value Date   CHOL 229 (H) 02/02/2024   HDL 49.60 02/02/2024   LDLCALC 159 (H) 02/02/2024   TRIG 100.0 02/02/2024   CHOLHDL 5 02/02/2024  Lipitor started last visit. Tolerating well. Update lipid panel.

## 2024-04-20 NOTE — Assessment & Plan Note (Signed)
 Resolved off of lisinopril .

## 2024-04-20 NOTE — Progress Notes (Unsigned)
 Subjective:     Patient ID: Victoria Mejia, female    DOB: 07/01/1970, 53 y.o.   MRN: [id number]  Chief Complaint  Patient presents with   Follow-up    Patient is here for her 3 month f/u. BP Medication changes and lab work    HPI  Discussed the use of AI scribe software for clinical note transcription with the patient, who gave verbal consent to proceed.  History of Present Illness Victoria Mejia is a 53 year old female with hypertension and diabetes who presents for a follow-up visit.  She has hypertension, currently well managed with amlodipine . She previously discontinued lisinopril  due to a cough. Her blood pressure was measured at 136/74 mmHg, and she has not experienced any recent swelling.  Her diabetes is managed with a recent A1c of 6.9%. She has not started Farxiga , which was discussed for kidney protection and blood sugar management. She occasionally uses ibuprofen  for cramping but is cautious due to potential kidney effects.  She takes Lipitor 20 mg for high cholesterol, initiated after a previous high cholesterol reading.  There is no recent pain or burning in her feet. She avoids going barefoot to prevent unnoticed injuries.  She has not had a mammogram this year, with the last one in December 2024. She recently had an eye exam at [facility] and is considering another due to new frames.      Health Maintenance Due  Topic Date Due   Hepatitis B Vaccines 19-59 Average Risk (1 of 3 - 19+ 3-dose series) Never done   Zoster Vaccines- Shingrix (1 of 2) Never done   Influenza Vaccine  12/05/2023   OPHTHALMOLOGY EXAM  12/31/2023   Mammogram  04/16/2024    Past Medical History:  Diagnosis Date   History of chicken pox    Hyperlipidemia    Hypertension     Past Surgical History:  Procedure Laterality Date   CESAREAN SECTION  2005   HYSTEROSCOPY N/A 01/25/2015   Procedure: HYSTEROSCOPY;  Surgeon: Carlin DELENA Centers, MD;  Location: [facility];   Service: Gynecology;  Laterality: N/A;   IUD REMOVAL N/A 01/25/2015   Procedure: INTRAUTERINE DEVICE (IUD) REMOVAL;  Surgeon: Carlin DELENA Centers, MD;  Location: [facility];  Service: Gynecology;  Laterality: N/A;    Family History  Problem Relation Age of Onset   Hypertension Father    Alcohol abuse Father    Cirrhosis Father    Stroke Maternal Grandmother    Arthritis Mother    Hyperlipidemia Mother    Heart disease Maternal Uncle 28    Social History   Socioeconomic History   Marital status: Single    Spouse name: Not on file   Number of children: Not on file   Years of education: Not on file   Highest education level: Bachelor's degree (e.g., BA, AB, BS)  Occupational History   Not on file  Tobacco Use   Smoking status: Never   Smokeless tobacco: Never  Substance and Sexual Activity   Alcohol use: Yes    Alcohol/week: 1.0 - 2.0 standard drink of alcohol    Types: 1 - 2 Glasses of wine per week    Comment: occasional   Drug use: No   Sexual activity: Yes    Partners: Male    Birth control/protection: Pill  Other Topics Concern   Not on file  Social History Narrative   Single, lives with her daughter   Daughter 2005    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    Completed college   No pets   Enjoys television   Social Drivers of Health   Tobacco Use: Low Risk (04/20/2024)   Patient History    Smoking Tobacco Use: Never    Smokeless Tobacco Use: Never    Passive Exposure: Not on file  Financial Resource Strain: Low Risk (04/20/2024)   Overall Financial Resource Strain (CARDIA)    Difficulty of Paying Living Expenses: Not hard at all  Food Insecurity: No Food Insecurity (04/20/2024)   Epic    Worried About Programme Researcher, Broadcasting/film/video in the Last Year: Never true    Ran Out of Food in the Last Year: Never true  Transportation Needs: No Transportation Needs (04/20/2024)   Epic    Lack of Transportation (Medical): No    Lack of Transportation (Non-Medical): No  Physical  Activity: Insufficiently Active (04/20/2024)   Exercise Vital Sign    Days of Exercise per Week: 2 days    Minutes of Exercise per Session: 20 min  Stress: No Stress Concern Present (04/20/2024)   [employer] of Occupational Health - Occupational Stress Questionnaire    Feeling of Stress: Only a little  Social Connections: Moderately Isolated (04/20/2024)   Social Connection and Isolation Panel    Frequency of Communication with Friends and Family: More than three times a week    Frequency of Social Gatherings with Friends and Family: Once a week    Attends Religious Services: 1 to 4 times per year    Active Member of [employer] or Organizations: No    Attends Engineer, Structural: Not on file    Marital Status: Never married  Intimate Partner Violence: Not on file  Depression (PHQ2-9): Low Risk (03/15/2024)   Depression (PHQ2-9)    PHQ-2 Score: 3  Alcohol Screen: Low Risk (04/20/2024)   Alcohol Screen    Last Alcohol Screening Score (AUDIT): 2  Housing: Low Risk (04/20/2024)   Epic    Unable to Pay for Housing in the Last Year: No    Number of Times Moved in the Last Year: 0    Homeless in the Last Year: No  Utilities: Not on file  Health Literacy: Not on file    Outpatient Medications Prior to Visit  Medication Sig Dispense Refill   ALYACEN 1/35 tablet TAKE 1 TABLET BY MOUTH EVERY DAY 84 tablet 3   amLODipine  (NORVASC ) 10 MG tablet Take 1 tablet (10 mg total) by mouth daily. 90 tablet 1   atorvastatin  (LIPITOR) 20 MG tablet Take 1 tablet (20 mg total) by mouth daily. 90 tablet 1   ibuprofen  (ADVIL ) 800 MG tablet Take 1 tablet (800 mg total) by mouth every 8 (eight) hours as needed. 30 tablet 5   loratadine (CLARITIN) 10 MG tablet Take by mouth.     ibuprofen  (ADVIL ) 800 MG tablet Take 1 tablet (800 mg total) by mouth every 8 (eight) hours as needed. 30 tablet 5   lisinopril  (ZESTRIL ) 2.5 MG tablet TAKE 1 TABLET(2.5 MG) BY MOUTH DAILY 90 tablet 1   No  facility-administered medications prior to visit.    Allergies[1]  ROS See HPI    Objective:    Physical Exam Constitutional:      General: She is not in acute distress.    Appearance: Normal appearance. She is well-developed.  HENT:     Head: Normocephalic and atraumatic.     Right Ear: External ear normal.     Left Ear: External  ear normal.  Eyes:     General: No scleral icterus. Neck:     Thyroid : No thyromegaly.  Cardiovascular:     Rate and Rhythm: Normal rate and regular rhythm.     Heart sounds: Normal heart sounds. No murmur heard. Pulmonary:     Effort: Pulmonary effort is normal. No respiratory distress.     Breath sounds: Normal breath sounds. No wheezing.  Musculoskeletal:     Cervical back: Neck supple.  Skin:    General: Skin is warm and dry.  Neurological:     Mental Status: She is alert and oriented to person, place, and time.  Psychiatric:        Mood and Affect: Mood normal.        Behavior: Behavior normal.        Thought Content: Thought content normal.        Judgment: Judgment normal.    Diabetic Foot Exam - Simple   Simple Foot Form Diabetic Foot exam was performed with the following findings: Yes 04/20/2024  5:51 PM  Visual Inspection No deformities, no ulcerations, no other skin breakdown bilaterally: Yes Sensation Testing See comments: Yes Pulse Check Posterior Tibialis and Dorsalis pulse intact bilaterally: Yes Comments Decreased sensation to monofilament bilaterally       BP 136/74 (BP Location: Right Arm, Patient Position: Sitting, Cuff Size: Normal)   Pulse (!) 104   Temp 98.4 F (36.9 C) (Oral)   Resp 16   Ht 5' 4 (1.626 m)   Wt 196 lb 12.8 oz (89.3 kg)   LMP 04/12/2024 (Approximate)   SpO2 98%   BMI 33.78 kg/m  Wt Readings from Last 3 Encounters:  04/20/24 196 lb 12.8 oz (89.3 kg)  03/15/24 194 lb 3.2 oz (88.1 kg)  01/27/24 194 lb (88 kg)       Assessment & Plan:   Problem List Items Addressed This Visit        Unprioritized   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 Had to stop ACE due to cough.  Willing to start farxiga  for renal protection and DM control.        Relevant Medications   dapagliflozin  propanediol (FARXIGA ) 5 MG TABS tablet   Other Relevant Orders   Comp Met (CMET)   Hyperlipidemia   Lab Results  Component Value Date   CHOL 229 (H) 02/02/2024   HDL 49.60 02/02/2024   LDLCALC 159 (H) 02/02/2024   TRIG 100.0 02/02/2024   CHOLHDL 5 02/02/2024  Lipitor started last visit. Tolerating well. Update lipid panel.        Relevant Orders   Lipid panel   Cough   Resolved off of lisinopril .       Other Visit Diagnoses       Breast cancer screening by mammogram       Relevant Orders   MM 3D SCREENING MAMMOGRAM BILATERAL BREAST     Needs flu shot       Relevant Orders   Flu vaccine trivalent PF, 6mos and older(Flulaval,Afluria,Fluarix,Fluzone)       I have discontinued Jalana M. Portocarrero's lisinopril . I am also having her start on dapagliflozin  propanediol. Additionally, I  am having her maintain her loratadine, ALYACEN 1/35, atorvastatin , ibuprofen , and amLODipine .  Meds ordered this encounter  Medications   dapagliflozin  propanediol (FARXIGA ) 5 MG TABS tablet    Sig: Take 1 tablet (5 mg total) by mouth daily.    Dispense:  30 tablet    Refill:  2    Supervising Provider:   DOMENICA BLACKBIRD A [[id number]]      [1] No Known Allergies

## 2024-04-21 ENCOUNTER — Ambulatory Visit: Payer: Self-pay | Admitting: Family

## 2024-04-21 LAB — COMPREHENSIVE METABOLIC PANEL WITH GFR
ALT: 14 U/L (ref 3–35)
AST: 12 U/L (ref 5–37)
Albumin: 4.3 g/dL (ref 3.5–5.2)
Alkaline Phosphatase: 88 U/L (ref 39–117)
BUN: 11 mg/dL (ref 6–23)
CO2: 27 meq/L (ref 19–32)
Calcium: 9.6 mg/dL (ref 8.4–10.5)
Chloride: 103 meq/L (ref 96–112)
Creatinine, Ser: 0.52 mg/dL (ref 0.40–1.20)
GFR: 107.36 mL/min (ref >60.00)
Glucose, Bld: 105 mg/dL — ABNORMAL HIGH (ref 70–99)
Potassium: 3.6 meq/L (ref 3.5–5.1)
Sodium: 140 meq/L (ref 135–145)
Total Bilirubin: 0.4 mg/dL (ref 0.2–1.2)
Total Protein: 7 g/dL (ref 6.0–8.3)

## 2024-04-21 LAB — LIPID PANEL
Cholesterol: 158 mg/dL (ref 28–200)
HDL: 49.6 mg/dL (ref >39.00)
LDL Cholesterol: 84 mg/dL (ref 10–99)
NonHDL: 108.87
Total CHOL/HDL Ratio: 3
Triglycerides: 126 mg/dL (ref 10.0–149.0)
VLDL: 25.2 mg/dL (ref 0.0–40.0)

## 2024-04-21 NOTE — Telephone Encounter (Signed)
 A1C is up from 6.5 to 6.9.  Farxiga  should help with this.  Also, cholesterol is much better but still not at goal. Please increase lipitor to 40mg  once daily.

## 2024-04-21 NOTE — Telephone Encounter (Signed)
 Electronic request sent

## 2024-04-22 NOTE — Patient Instructions (Signed)
°  VISIT SUMMARY: Today, you came in for a follow-up visit to manage your hypertension, diabetes, and high cholesterol. We reviewed your current medications and discussed some new treatment options to better control your conditions and protect your kidneys.  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 This can cause complications like kidney damage and nerve damage in your feet. Your A1c level is 6.9%, which shows good control but can be improved. We have prescribed Farxiga  to help protect your kidneys and manage your blood sugar. Please download a coupon from the Farxiga  website to reduce the cost. We also ordered an A1c test today. Remember to maintain good hygiene to prevent yeast infections, avoid going barefoot, and inspect your feet daily for any sores.  -HYPERTENSION: Hypertension, or high blood pressure, is when the force of your blood against your artery walls is too high. Your blood pressure is well-managed with amlodipine , and today's reading was 136/74. Continue taking amlodipine  10 mg daily. Farxiga  will also help protect your kidneys and control your blood sugar.  -HYPERLIPIDEMIA: Hyperlipidemia is when you have high levels of fats (like cholesterol) in your blood. You are currently taking Lipitor 20 mg daily to manage this. We ordered a cholesterol test today to check your levels.  -GENERAL HEALTH MAINTENANCE: We discussed routine health maintenance, including vaccinations and screenings. You are overdue for a mammogram, so we ordered one for you. You received a flu shot today. We also discussed the shingles and hepatitis B vaccines for future consideration.  INSTRUCTIONS: Please follow up with the ordered A1c and cholesterol tests. Schedule your mammogram as soon as possible. Continue taking your medications as prescribed. Download the  Farxiga  coupon to help with the cost. Maintain good foot hygiene and inspect your feet daily. Consider the shingles and hepatitis B vaccines in the future.                      Contains text generated by Abridge.                                 Contains text generated by Abridge.

## 2024-04-27 ENCOUNTER — Ambulatory Visit: Admitting: Family

## 2024-04-28 MED ORDER — ATORVASTATIN CALCIUM 40 MG PO TABS: 40.0000 mg | ORAL_TABLET | Freq: Every day | ORAL | 1 refills | Status: AC

## 2024-05-22 ENCOUNTER — Ambulatory Visit (HOSPITAL_BASED_OUTPATIENT_CLINIC_OR_DEPARTMENT_OTHER)
Admission: RE | Admit: 2024-05-22 | Discharge: 2024-05-22 | Disposition: A | Source: Ambulatory Visit | Attending: Family | Admitting: Family

## 2024-05-22 DIAGNOSIS — Z1231 Encounter for screening mammogram for malignant neoplasm of breast: Secondary | ICD-10-CM | POA: Insufficient documentation
